# Patient Record
Sex: Female | Born: 1955 | Race: White | Hispanic: No | Smoking: Never smoker
Health system: Southern US, Community
[De-identification: ages and names within clinical notes are randomized; demographics above are authoritative.]

## PROBLEM LIST (undated history)

## (undated) DIAGNOSIS — J189 Pneumonia, unspecified organism: Secondary | ICD-10-CM

## (undated) DIAGNOSIS — Z8489 Family history of other specified conditions: Secondary | ICD-10-CM

## (undated) DIAGNOSIS — I10 Essential (primary) hypertension: Secondary | ICD-10-CM

## (undated) DIAGNOSIS — J42 Unspecified chronic bronchitis: Secondary | ICD-10-CM

## (undated) DIAGNOSIS — J45909 Unspecified asthma, uncomplicated: Secondary | ICD-10-CM

## (undated) DIAGNOSIS — G43909 Migraine, unspecified, not intractable, without status migrainosus: Secondary | ICD-10-CM

## (undated) DIAGNOSIS — M1990 Unspecified osteoarthritis, unspecified site: Secondary | ICD-10-CM

## (undated) DIAGNOSIS — D649 Anemia, unspecified: Secondary | ICD-10-CM

## (undated) DIAGNOSIS — Z8739 Personal history of other diseases of the musculoskeletal system and connective tissue: Secondary | ICD-10-CM

## (undated) HISTORY — PX: EYE SURGERY: SHX253

## (undated) HISTORY — PX: ESOPHAGOGASTRODUODENOSCOPY: SHX1529

## (undated) HISTORY — PX: JOINT REPLACEMENT: SHX530

## (undated) HISTORY — PX: GLAUCOMA SURGERY: SHX656

---

## 1984-12-21 HISTORY — PX: CHOLECYSTECTOMY OPEN: SUR202

## 1999-01-04 ENCOUNTER — Other Ambulatory Visit: Admission: RE | Admit: 1999-01-04 | Discharge: 1999-01-04 | Payer: Self-pay | Admitting: Obstetrics and Gynecology

## 2011-10-16 ENCOUNTER — Emergency Department (HOSPITAL_BASED_OUTPATIENT_CLINIC_OR_DEPARTMENT_OTHER): Payer: BC Managed Care – PPO

## 2011-10-16 ENCOUNTER — Emergency Department (HOSPITAL_BASED_OUTPATIENT_CLINIC_OR_DEPARTMENT_OTHER)
Admission: EM | Admit: 2011-10-16 | Discharge: 2011-10-16 | Disposition: A | Discharge status: Home / Self Care | Payer: BC Managed Care – PPO | Attending: Emergency Medicine | Admitting: Emergency Medicine

## 2011-10-16 ENCOUNTER — Encounter (HOSPITAL_BASED_OUTPATIENT_CLINIC_OR_DEPARTMENT_OTHER): Payer: Self-pay | Admitting: Emergency Medicine

## 2011-10-16 DIAGNOSIS — S59909A Unspecified injury of unspecified elbow, initial encounter: Secondary | ICD-10-CM | POA: Insufficient documentation

## 2011-10-16 DIAGNOSIS — S6990XA Unspecified injury of unspecified wrist, hand and finger(s), initial encounter: Secondary | ICD-10-CM | POA: Insufficient documentation

## 2011-10-16 DIAGNOSIS — S60229A Contusion of unspecified hand, initial encounter: Secondary | ICD-10-CM

## 2011-10-16 DIAGNOSIS — W2209XA Striking against other stationary object, initial encounter: Secondary | ICD-10-CM | POA: Insufficient documentation

## 2011-10-16 DIAGNOSIS — Y92009 Unspecified place in unspecified non-institutional (private) residence as the place of occurrence of the external cause: Secondary | ICD-10-CM | POA: Insufficient documentation

## 2011-10-16 DIAGNOSIS — S59919A Unspecified injury of unspecified forearm, initial encounter: Secondary | ICD-10-CM | POA: Insufficient documentation

## 2011-10-16 HISTORY — DX: Migraine, unspecified, not intractable, without status migrainosus: G43.909

## 2011-10-16 IMAGING — CR DG HAND COMPLETE 3+V*R*
3 series · 3 of 3 positions shown · non-contrast
Comparison: None.

CLINICAL DATA: Fall, pain.

RIGHT HAND - COMPLETE 3+ VIEW

[x hand pa right]
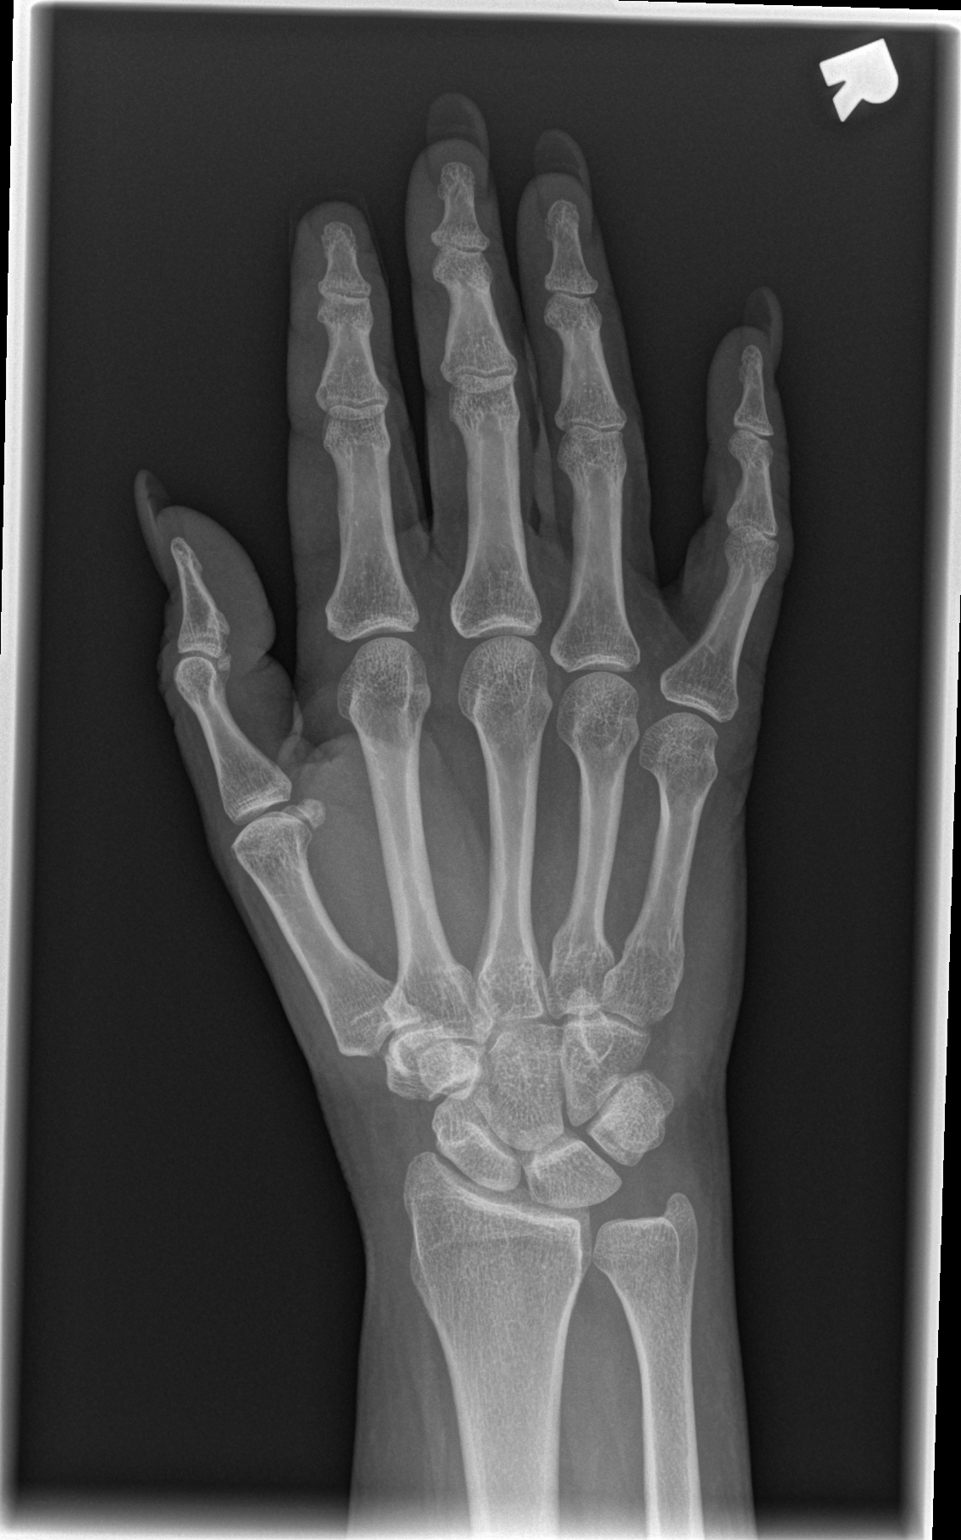

[x hand oblique right]
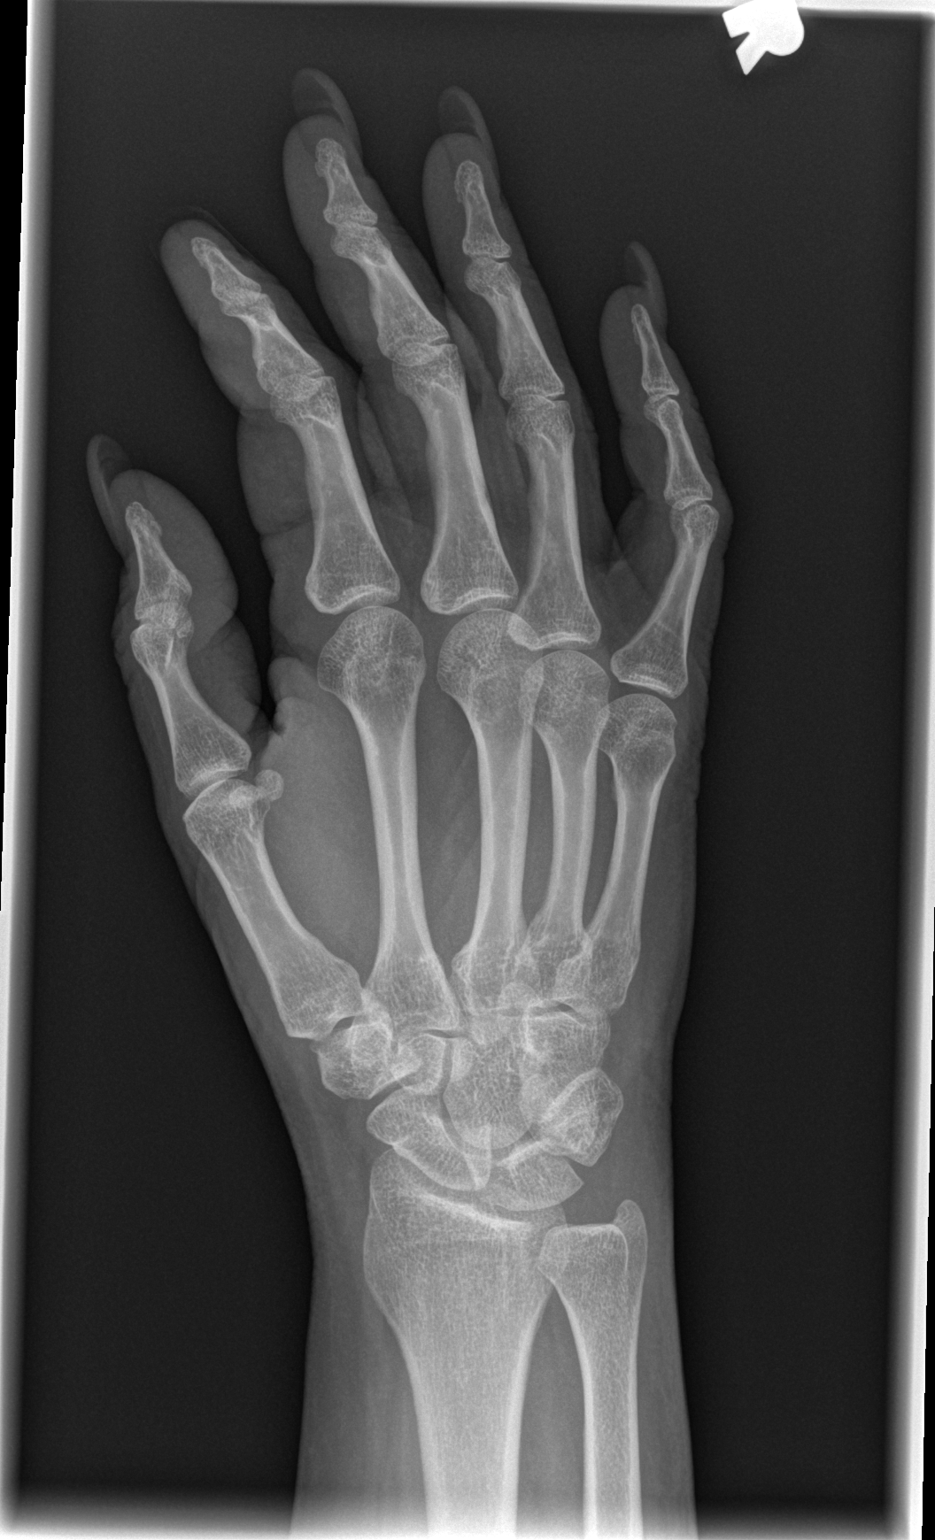

[x hand lat right]
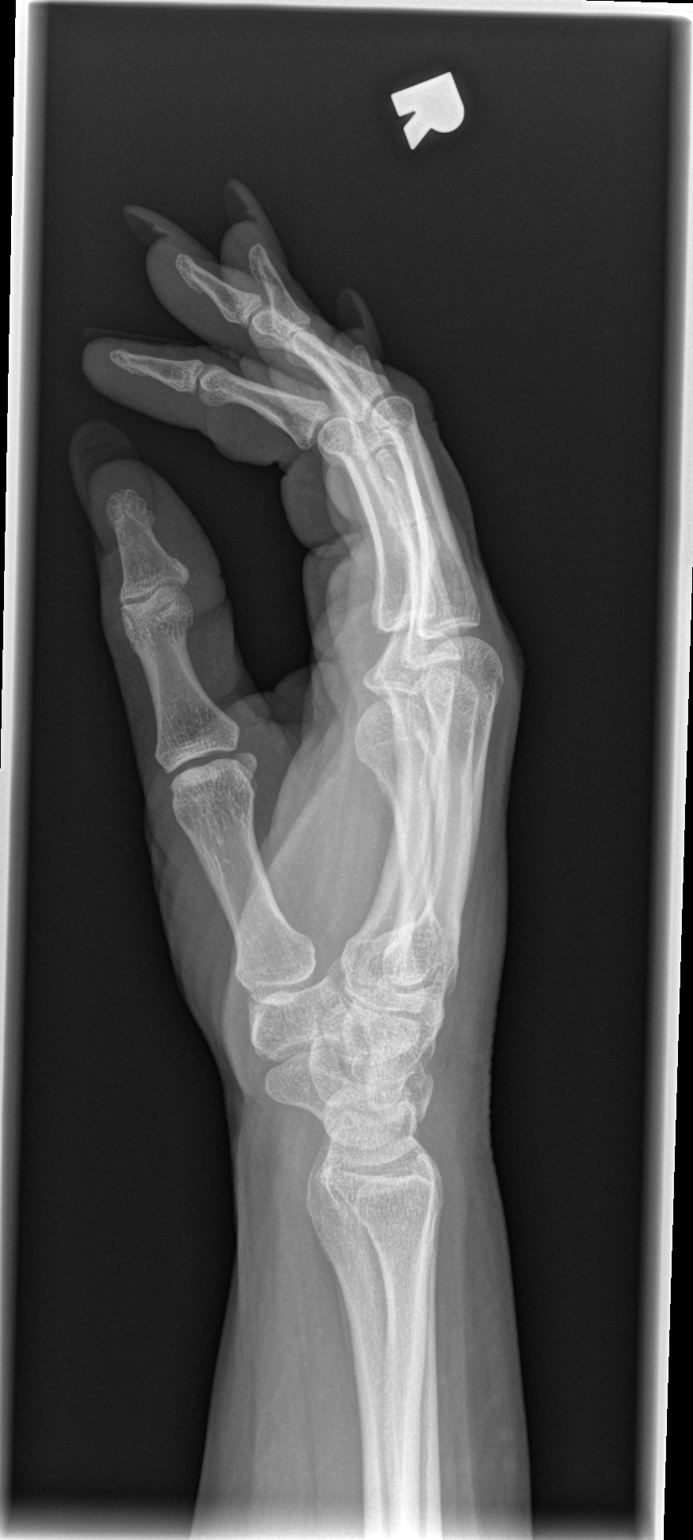

[3 of 3 positions shown; findings below may reference images not displayed]

FINDINGS: Imaged bones, joints and soft tissues appear normal.
IMPRESSION: Negative exam.

## 2011-10-16 MED ORDER — OXYCODONE-ACETAMINOPHEN 5-325 MG PO TABS
2.0000 | ORAL_TABLET | Freq: Once | ORAL | Status: DC
  Filled 2011-10-16: qty 2

## 2011-10-16 MED ORDER — OXYCODONE-ACETAMINOPHEN 5-325 MG PO TABS
1.0000 | ORAL_TABLET | Freq: Once | ORAL | Status: AC
  Administered 2011-10-16: 1 via ORAL
  Filled 2011-10-16 (×2): qty 1

## 2011-10-16 MED ORDER — OXYCODONE-ACETAMINOPHEN 5-325 MG PO TABS: 1.0000 | ORAL_TABLET | Freq: Four times a day (QID) | ORAL | Status: AC | PRN

## 2011-10-16 NOTE — ED Provider Notes (Signed)
History    This chart was scribed for Maximos Zayas B. Bernette Mayers, MD, MD by Smitty Pluck. The patient was seen in room MH09 and the patient's care was started at 10:20PM.   CSN: [id number]  Arrival date & time 10/16/11  2140   First MD Initiated Contact with Patient 10/16/11 2219      Chief Complaint  Patient presents with  . Hand Injury  . Arm Injury    (Consider location/radiation/quality/duration/timing/severity/associated sxs/prior treatment) Patient is a 56 y.o. female presenting with hand injury and arm injury. The history is provided by the patient.  Hand Injury   Arm Injury    Taylor Short is a 56 y.o. female who presents to the Emergency Department complaining of constant, moderate right hand pain and right arm pain onset today within last hour. Pt reports that she tripped on a rug causing her to hit right arm and right hand on wall at home. She reports damaging her nail. Denies active bleeding from nail. Pt reports movement of right arm and right hand aggravates the pain. Pt denies HTN and diabetes. She reports hx of gout and migraines. Denies radiation.   Past Medical History  Diagnosis Date  . Migraine     Past Surgical History  Procedure Date  . Cholecystectomy     No family history on file.  History  Substance Use Topics  . Smoking status: Never Smoker   . Smokeless tobacco: Not on file  . Alcohol Use: No    OB History    Grav Para Term Preterm Abortions TAB SAB Ect Mult Living                  Review of Systems  All other systems reviewed and are negative.   10 Systems reviewed and all are negative for acute change except as noted in the HPI.   Allergies  Aspirin and Codeine  Home Medications  No current outpatient prescriptions on file.  BP 137/86  Pulse 67  Temp 97.8 F (36.6 C) (Oral)  Resp 18  Ht 5\' 6"  (1.676 m)  Wt 185 lb (83.915 kg)  BMI 29.86 kg/m2  SpO2 100%  Physical Exam  Nursing note and vitals reviewed. Constitutional: She  is oriented to person, place, and time. She appears well-developed and well-nourished.  HENT:  Head: Normocephalic and atraumatic.  Neck: Neck supple.  Pulmonary/Chest: Effort normal.  Musculoskeletal:       Contusion to dorsal right hand with superficial abrasion to right hand and right wrist   Neurological: She is alert and oriented to person, place, and time. No cranial nerve deficit.  Psychiatric: She has a normal mood and affect. Her behavior is normal.    ED Course  Procedures (including critical care time) DIAGNOSTIC STUDIES: Oxygen Saturation is 100% on room air, normal by my interpretation.    COORDINATION OF CARE:    Labs Reviewed - No data to display Dg Forearm Right  10/16/2011  *RADIOLOGY REPORT*  Clinical Data: Fall, pain.  RIGHT FOREARM - 2 VIEW  Comparison: None.  Findings: Imaged bones, joints and soft tissues appear normal.  IMPRESSION: Negative exam.   Original Report Authenticated By: Bernadene Bell. Maricela Curet, M.D.    Dg Hand Complete Right  10/16/2011  *RADIOLOGY REPORT*  Clinical Data: Fall, pain.  RIGHT HAND - COMPLETE 3+ VIEW  Comparison: None.  Findings: Imaged bones, joints and soft tissues appear normal.  IMPRESSION: Negative exam.   Original Report Authenticated By: Bernadene Bell. Maricela Curet, M.D.  No diagnosis found.    MDM  Xrays neg as above for fracture. Given velcro wrist splint for comfort. Pain medications as needed.    I personally performed the services described in the documentation, which were scribed in my presence. The recorded information has been reviewed and considered.        Taylor Formoso B. Bernette Mayers, MD 10/16/11 2232

## 2011-10-16 NOTE — ED Notes (Signed)
Pt. Reports hitting a wall with her r hand and R lower arm today.  Pt. Is in no distress but rating pain 9/10

## 2011-10-16 NOTE — ED Notes (Signed)
Pt tripped and hit right hand on door way. Pt has bruising to posterior right hand and reports pain up right arm.

## 2015-08-30 ENCOUNTER — Other Ambulatory Visit: Payer: Self-pay | Admitting: Orthopaedic Surgery

## 2015-09-07 ENCOUNTER — Encounter (HOSPITAL_COMMUNITY)
Admission: RE | Admit: 2015-09-07 | Discharge: 2015-09-07 | Disposition: A | Discharge status: Home / Self Care | Payer: BLUE CROSS/BLUE SHIELD | Source: Ambulatory Visit | Attending: Orthopaedic Surgery | Admitting: Orthopaedic Surgery

## 2015-09-07 ENCOUNTER — Encounter (HOSPITAL_COMMUNITY): Payer: Self-pay

## 2015-09-07 DIAGNOSIS — Z01812 Encounter for preprocedural laboratory examination: Secondary | ICD-10-CM | POA: Insufficient documentation

## 2015-09-07 DIAGNOSIS — Z01810 Encounter for preprocedural cardiovascular examination: Secondary | ICD-10-CM | POA: Diagnosis present

## 2015-09-07 HISTORY — DX: Anemia, unspecified: D64.9

## 2015-09-07 HISTORY — DX: Essential (primary) hypertension: I10

## 2015-09-07 HISTORY — DX: Unspecified osteoarthritis, unspecified site: M19.90

## 2015-09-07 HISTORY — DX: Unspecified asthma, uncomplicated: J45.909

## 2015-09-07 LAB — COMPREHENSIVE METABOLIC PANEL
ALT: 14 U/L (ref 14–54)
ANION GAP: 6 (ref 5–15)
AST: 16 U/L (ref 15–41)
Albumin: 3.7 g/dL (ref 3.5–5.0)
Alkaline Phosphatase: 80 U/L (ref 38–126)
BUN: 12 mg/dL (ref 6–20)
CHLORIDE: 109 mmol/L (ref 101–111)
CO2: 26 mmol/L (ref 22–32)
CREATININE: 0.61 mg/dL (ref 0.44–1.00)
Calcium: 9.3 mg/dL (ref 8.9–10.3)
Glucose, Bld: 94 mg/dL (ref 65–99)
POTASSIUM: 3.7 mmol/L (ref 3.5–5.1)
SODIUM: 141 mmol/L (ref 135–145)
Total Bilirubin: 0.6 mg/dL (ref 0.3–1.2)
Total Protein: 7.1 g/dL (ref 6.5–8.1)

## 2015-09-07 LAB — URINE MICROSCOPIC-ADD ON

## 2015-09-07 LAB — PROTIME-INR
INR: 1 (ref 0.00–1.49)
PROTHROMBIN TIME: 13.4 s (ref 11.6–15.2)

## 2015-09-07 LAB — URINALYSIS, ROUTINE W REFLEX MICROSCOPIC
Bilirubin Urine: NEGATIVE
GLUCOSE, UA: NEGATIVE mg/dL
HGB URINE DIPSTICK: NEGATIVE
KETONES UR: NEGATIVE mg/dL
Nitrite: NEGATIVE
PH: 6 (ref 5.0–8.0)
PROTEIN: NEGATIVE mg/dL
Specific Gravity, Urine: 1.024 (ref 1.005–1.030)

## 2015-09-07 LAB — APTT: APTT: 24 s (ref 24–37)

## 2015-09-07 LAB — TYPE AND SCREEN
ABO/RH(D): O POS
ANTIBODY SCREEN: NEGATIVE

## 2015-09-07 LAB — C-REACTIVE PROTEIN

## 2015-09-07 LAB — SURGICAL PCR SCREEN
MRSA, PCR: NEGATIVE
Staphylococcus aureus: POSITIVE — AB

## 2015-09-07 LAB — ABO/RH: ABO/RH(D): O POS

## 2015-09-07 NOTE — Progress Notes (Signed)
Lab called and stated that CBC and Sed rate labs were clotted need to redraw DOS

## 2015-09-07 NOTE — Pre-Procedure Instructions (Signed)
Claiborne BillingsDee Anna Kucharski  09/07/2015      [id number] [address], [place] - [address] [place] [id number] Phone: [phone] Fax: [phone]    Your procedure is scheduled on 09/16/15  [facility] Admitting at [phone] A.M.  Call this number if you have problems the morning of surgery:  [phone]   Remember:  Do not eat food or drink liquids after midnight.   Take these medicines the morning of surgery with A SIP OF WATER gabapentin (neurontin)  7 days prior to surgery STOP taking any Aspirin, Aleve, Naproxen, Ibuprofen, Motrin, Advil, Goody's, BC's, all herbal medications, fish oil, and all vitamins    Do not wear jewelry, make-up or nail polish.  Do not wear lotions, powders, or perfumes.  You may NOT wear deoderant.  Do not shave 48 hours prior to surgery.   Do not bring valuables to the hospital.  [facility] is not responsible for any belongings or valuables.  Contacts, dentures or bridgework may not be worn into surgery.  Leave your suitcase in the car.  After surgery it may be brought to your room.  For patients admitted to the hospital, discharge time will be determined by your treatment team.  Patients discharged the day of surgery will not be allowed to drive home.    Special instructions:   [facility]- Preparing For Surgery  Before surgery, you can play an important role. Because skin is not sterile, your skin needs to be as free of germs as possible. You can reduce the number of germs on your skin by washing with CHG (chlorahexidine gluconate) Soap before surgery.  CHG is an antiseptic cleaner which kills germs and bonds with the skin to continue killing germs even after washing.  Please do not use if you have an allergy to CHG or antibacterial soaps. If your skin becomes reddened/irritated stop using the CHG.  Do not shave (including legs and underarms) for at least 48 hours prior to first CHG shower. It is OK  to shave your face.  Please follow these instructions carefully.   1. Shower the NIGHT BEFORE SURGERY and the MORNING OF SURGERY with CHG.   2. If you chose to wash your hair, wash your hair first as usual with your normal shampoo.  3. After you shampoo, rinse your hair and body thoroughly to remove the shampoo.  4. Use CHG as you would any other liquid soap. You can apply CHG directly to the skin and wash gently with a scrungie or a clean washcloth.   5. Apply the CHG Soap to your body ONLY FROM THE NECK DOWN.  Do not use on open wounds or open sores. Avoid contact with your eyes, ears, mouth and genitals (private parts). Wash genitals (private parts) with your normal soap.  6. Wash thoroughly, paying special attention to the area where your surgery will be performed.  7. Thoroughly rinse your body with warm water from the neck down.  8. DO NOT shower/wash with your normal soap after using and rinsing off the CHG Soap.  9. Pat yourself dry with a CLEAN TOWEL.   10. Wear CLEAN PAJAMAS   11. Place CLEAN SHEETS on your bed the night of your first shower and DO NOT SLEEP WITH PETS.    Day of Surgery: Do not apply any deodorants/lotions. Please wear clean clothes to the hospital/surgery center.      Please read over the following fact sheets  that you were given. Pain Booklet, Coughing and Deep Breathing, Total Joint Packet, MRSA Information and Surgical Site Infection Prevention, incentive spirometry

## 2015-09-07 NOTE — Progress Notes (Signed)
PCP - Deneen Hartselizabeth Todd - [facility] Cardi[facility] - denies  Chest x-ray - not needed EKG - 09/07/15 Stress Test - denies ECHO - denies Cardiac Cath - denies  Patient's blood pressure was elevated 190s/90s, patient stated that her PCP told her they may need to look at blood pressure medications for her.  Contacted allison about EKG and possible follow-up about pressure...allison agreed.  Patient was instructed to call PCP today and schedule an appointment     Patient denies shortness of breath, fever, cough and chest pain at PAT appointment

## 2015-09-07 NOTE — Progress Notes (Signed)
Patient PCR was positive for staph, [facility] [place] [phone].  Left message on patient's phone about instructions and where to pick up prescription up at

## 2015-09-08 ENCOUNTER — Encounter (HOSPITAL_COMMUNITY): Payer: Self-pay

## 2015-09-08 NOTE — Progress Notes (Signed)
Anesthesia Chart Review: Patient is a 60 year old female scheduled for left THA, anterior approach on 09/16/15 by Dr. Roda ShuttersXu.   History includes non-smoker, migraines, anemia, asthma, HTN (untreated until 09/07/15). PCP is Deneen HartsElizabeth Todd, FNP with [facility](Care Everywhere).  PAT Vitals: BP 190s/90s" on arrival and 170/96 following her nurse visit. HR 69, RR 20, T 36.6C, O2 sat 100%. (At her PAT visit, she did not report any anti-hypertensive medications but reported that she was told she may need to start one in the near future. Due to her elevated BP at PAT, she was advised to follow-up with her PCP office regarding getting BP better controlled prior to surgery. She was seen by [place] Fulbright, PA-C on 09/08/15. Her note indicates that patient had previously been prescribed lisinopril, but patient never got it filled until yesterday when she learned her uncontrolled HTN could delay her surgery. She apparently started lisinopril with BP today of 130/90 at that visit.)  Meds include albuterol, gabapentin, meloxicam. Lisinopril 10 mg recently started.  09/07/15 EKG: NSR.  Preoperative labs WNL.   I updated Sherrie at Dr. Warren DanesXu's office of HTN follow-up and that she was started on Lotrisone cream for breast rash at that visit. If BP is acceptable and otherwise no acute changes on her surgery date then I would anticipate that she could proceed as planned.  Velna Ochsllison Eleena Grater, PA-C [facility]/Anesthesiology Phone [phone] 09/08/2015 3:05 PM

## 2015-09-15 MED ORDER — TRANEXAMIC ACID 1000 MG/10ML IV SOLN
1000.0000 mg | INTRAVENOUS | Status: AC
  Administered 2015-09-16: 1000 mg via INTRAVENOUS
  Filled 2015-09-15: qty 10

## 2015-09-15 MED ORDER — CEFAZOLIN SODIUM-DEXTROSE 2-4 GM/100ML-% IV SOLN
2.0000 g | INTRAVENOUS | Status: AC
  Administered 2015-09-16: 2 g via INTRAVENOUS
  Filled 2015-09-15: qty 100

## 2015-09-16 ENCOUNTER — Inpatient Hospital Stay (HOSPITAL_COMMUNITY): Payer: BLUE CROSS/BLUE SHIELD | Admitting: Vascular Surgery

## 2015-09-16 ENCOUNTER — Encounter (HOSPITAL_COMMUNITY)
Admission: RE | Disposition: A | Discharge status: Home Health | Payer: Self-pay | Source: Ambulatory Visit | Attending: Orthopaedic Surgery

## 2015-09-16 ENCOUNTER — Inpatient Hospital Stay (HOSPITAL_COMMUNITY): Payer: BLUE CROSS/BLUE SHIELD | Admitting: Anesthesiology

## 2015-09-16 ENCOUNTER — Inpatient Hospital Stay (HOSPITAL_COMMUNITY): Payer: BLUE CROSS/BLUE SHIELD

## 2015-09-16 ENCOUNTER — Encounter (HOSPITAL_COMMUNITY): Payer: Self-pay | Admitting: Anesthesiology

## 2015-09-16 ENCOUNTER — Inpatient Hospital Stay (HOSPITAL_COMMUNITY)
Admission: RE | Admit: 2015-09-16 | Discharge: 2015-09-18 | DRG: 470 | Disposition: A | Discharge status: Home Health | Payer: BLUE CROSS/BLUE SHIELD | Source: Ambulatory Visit | Attending: Orthopaedic Surgery | Admitting: Orthopaedic Surgery

## 2015-09-16 DIAGNOSIS — I10 Essential (primary) hypertension: Secondary | ICD-10-CM | POA: Diagnosis present

## 2015-09-16 DIAGNOSIS — M25552 Pain in left hip: Secondary | ICD-10-CM | POA: Diagnosis present

## 2015-09-16 DIAGNOSIS — G43909 Migraine, unspecified, not intractable, without status migrainosus: Secondary | ICD-10-CM | POA: Diagnosis present

## 2015-09-16 DIAGNOSIS — Z791 Long term (current) use of non-steroidal anti-inflammatories (NSAID): Secondary | ICD-10-CM

## 2015-09-16 DIAGNOSIS — Z96642 Presence of left artificial hip joint: Secondary | ICD-10-CM

## 2015-09-16 DIAGNOSIS — D62 Acute posthemorrhagic anemia: Secondary | ICD-10-CM | POA: Diagnosis not present

## 2015-09-16 DIAGNOSIS — M1612 Unilateral primary osteoarthritis, left hip: Principal | ICD-10-CM | POA: Diagnosis present

## 2015-09-16 DIAGNOSIS — Z96649 Presence of unspecified artificial hip joint: Secondary | ICD-10-CM

## 2015-09-16 DIAGNOSIS — Z419 Encounter for procedure for purposes other than remedying health state, unspecified: Secondary | ICD-10-CM

## 2015-09-16 HISTORY — DX: Pneumonia, unspecified organism: J18.9

## 2015-09-16 HISTORY — DX: Family history of other specified conditions: Z84.89

## 2015-09-16 HISTORY — DX: Unspecified chronic bronchitis: J42

## 2015-09-16 HISTORY — DX: Personal history of other diseases of the musculoskeletal system and connective tissue: Z87.39

## 2015-09-16 HISTORY — PX: TOTAL HIP ARTHROPLASTY: SHX124

## 2015-09-16 LAB — CBC WITH DIFFERENTIAL/PLATELET
Basophils Absolute: 0 10*3/uL (ref 0.0–0.1)
Basophils Relative: 1 %
EOS ABS: 0.1 10*3/uL (ref 0.0–0.7)
EOS PCT: 1 %
HCT: 41.6 % (ref 36.0–46.0)
HEMOGLOBIN: 13.8 g/dL (ref 12.0–15.0)
LYMPHS ABS: 1.5 10*3/uL (ref 0.7–4.0)
LYMPHS PCT: 25 %
MCH: 30.2 pg (ref 26.0–34.0)
MCHC: 33.2 g/dL (ref 30.0–36.0)
MCV: 91 fL (ref 78.0–100.0)
MONOS PCT: 10 %
Monocytes Absolute: 0.6 10*3/uL (ref 0.1–1.0)
Neutro Abs: 3.8 10*3/uL (ref 1.7–7.7)
Neutrophils Relative %: 63 %
PLATELETS: 240 10*3/uL (ref 150–400)
RBC: 4.57 MIL/uL (ref 3.87–5.11)
RDW: 13.8 % (ref 11.5–15.5)
WBC: 6.1 10*3/uL (ref 4.0–10.5)

## 2015-09-16 LAB — CBC
HEMATOCRIT: 36.7 % (ref 36.0–46.0)
Hemoglobin: 11.5 g/dL — ABNORMAL LOW (ref 12.0–15.0)
MCH: 29.3 pg (ref 26.0–34.0)
MCHC: 31.3 g/dL (ref 30.0–36.0)
MCV: 93.4 fL (ref 78.0–100.0)
PLATELETS: 210 10*3/uL (ref 150–400)
RBC: 3.93 MIL/uL (ref 3.87–5.11)
RDW: 13.5 % (ref 11.5–15.5)
WBC: 10.8 10*3/uL — AB (ref 4.0–10.5)

## 2015-09-16 LAB — CREATININE, SERUM: CREATININE: 0.58 mg/dL (ref 0.44–1.00)

## 2015-09-16 LAB — SEDIMENTATION RATE: Sed Rate: 15 mm/hr (ref 0–22)

## 2015-09-16 SURGERY — ARTHROPLASTY, HIP, TOTAL, ANTERIOR APPROACH
Anesthesia: Spinal | Site: Hip | Laterality: Left

## 2015-09-16 MED ORDER — ACETAMINOPHEN 650 MG RE SUPP: 650.0000 mg | Freq: Four times a day (QID) | RECTAL | Status: DC | PRN

## 2015-09-16 MED ORDER — METHOCARBAMOL 1000 MG/10ML IJ SOLN
500.0000 mg | Freq: Four times a day (QID) | INTRAMUSCULAR | Status: DC | PRN
  Filled 2015-09-16: qty 5

## 2015-09-16 MED ORDER — HYDROMORPHONE HCL 1 MG/ML IJ SOLN
INTRAMUSCULAR | Status: AC
  Filled 2015-09-16: qty 1

## 2015-09-16 MED ORDER — MAGNESIUM CITRATE PO SOLN: 1.0000 | Freq: Once | ORAL | Status: DC | PRN

## 2015-09-16 MED ORDER — KETOROLAC TROMETHAMINE 30 MG/ML IJ SOLN
INTRAMUSCULAR | Status: AC
  Filled 2015-09-16: qty 1

## 2015-09-16 MED ORDER — METOCLOPRAMIDE HCL 5 MG PO TABS: 5.0000 mg | ORAL_TABLET | Freq: Three times a day (TID) | ORAL | Status: DC | PRN

## 2015-09-16 MED ORDER — SODIUM CHLORIDE 0.9% FLUSH
INTRAVENOUS | Status: DC | PRN
  Administered 2015-09-16: 40 mL

## 2015-09-16 MED ORDER — DIPHENHYDRAMINE HCL 12.5 MG/5ML PO ELIX: 25.0000 mg | ORAL_SOLUTION | ORAL | Status: DC | PRN

## 2015-09-16 MED ORDER — METOCLOPRAMIDE HCL 5 MG/ML IJ SOLN
5.0000 mg | Freq: Three times a day (TID) | INTRAMUSCULAR | Status: DC | PRN
  Administered 2015-09-16 – 2015-09-17 (×3): 10 mg via INTRAVENOUS
  Filled 2015-09-16 (×3): qty 2

## 2015-09-16 MED ORDER — METHOCARBAMOL 500 MG PO TABS
ORAL_TABLET | ORAL | Status: AC
  Filled 2015-09-16: qty 1

## 2015-09-16 MED ORDER — TRANEXAMIC ACID 1000 MG/10ML IV SOLN
INTRAVENOUS | Status: DC | PRN
  Administered 2015-09-16: 2000 mg via TOPICAL

## 2015-09-16 MED ORDER — SODIUM CHLORIDE 0.9 % IV SOLN
INTRAVENOUS | Status: DC
  Administered 2015-09-16 – 2015-09-17 (×4): via INTRAVENOUS

## 2015-09-16 MED ORDER — FENTANYL CITRATE (PF) 100 MCG/2ML IJ SOLN
INTRAMUSCULAR | Status: DC | PRN
  Administered 2015-09-16: 50 ug via INTRAVENOUS
  Administered 2015-09-16: 100 ug via INTRAVENOUS
  Administered 2015-09-16 (×2): 50 ug via INTRAVENOUS

## 2015-09-16 MED ORDER — OXYCODONE HCL ER 10 MG PO T12A: 10.0000 mg | EXTENDED_RELEASE_TABLET | Freq: Two times a day (BID) | ORAL | Status: DC

## 2015-09-16 MED ORDER — METHOCARBAMOL 750 MG PO TABS: 750.0000 mg | ORAL_TABLET | Freq: Two times a day (BID) | ORAL | 0 refills | Status: DC | PRN

## 2015-09-16 MED ORDER — CHLORHEXIDINE GLUCONATE 4 % EX LIQD: 60.0000 mL | Freq: Once | CUTANEOUS | Status: DC

## 2015-09-16 MED ORDER — MIDAZOLAM HCL 5 MG/5ML IJ SOLN
INTRAMUSCULAR | Status: DC | PRN
  Administered 2015-09-16: 2 mg via INTRAVENOUS

## 2015-09-16 MED ORDER — FENTANYL CITRATE (PF) 250 MCG/5ML IJ SOLN
INTRAMUSCULAR | Status: AC
  Filled 2015-09-16: qty 5

## 2015-09-16 MED ORDER — SORBITOL 70 % SOLN: 30.0000 mL | Freq: Every day | Status: DC | PRN

## 2015-09-16 MED ORDER — BUPIVACAINE LIPOSOME 1.3 % IJ SUSP
20.0000 mL | INTRAMUSCULAR | Status: AC
  Administered 2015-09-16: 20 mL
  Filled 2015-09-16: qty 20

## 2015-09-16 MED ORDER — ACETAMINOPHEN 500 MG PO TABS
1000.0000 mg | ORAL_TABLET | Freq: Four times a day (QID) | ORAL | Status: AC
  Administered 2015-09-16 – 2015-09-17 (×4): 1000 mg via ORAL
  Filled 2015-09-16 (×4): qty 2

## 2015-09-16 MED ORDER — GABAPENTIN 300 MG PO CAPS
600.0000 mg | ORAL_CAPSULE | Freq: Every day | ORAL | Status: DC
  Administered 2015-09-16 – 2015-09-17 (×2): 600 mg via ORAL
  Filled 2015-09-16 (×4): qty 2

## 2015-09-16 MED ORDER — ENOXAPARIN SODIUM 40 MG/0.4ML ~~LOC~~ SOLN: 40.0000 mg | Freq: Every day | SUBCUTANEOUS | 0 refills | Status: DC

## 2015-09-16 MED ORDER — SENNOSIDES-DOCUSATE SODIUM 8.6-50 MG PO TABS: 1.0000 | ORAL_TABLET | Freq: Every evening | ORAL | 1 refills | Status: DC | PRN

## 2015-09-16 MED ORDER — PROPOFOL 500 MG/50ML IV EMUL
INTRAVENOUS | Status: DC | PRN
  Administered 2015-09-16: 25 ug/kg/min via INTRAVENOUS

## 2015-09-16 MED ORDER — EPHEDRINE SULFATE 50 MG/ML IJ SOLN
INTRAMUSCULAR | Status: DC | PRN
  Administered 2015-09-16: 10 mg via INTRAVENOUS

## 2015-09-16 MED ORDER — MENTHOL 3 MG MT LOZG: 1.0000 | LOZENGE | OROMUCOSAL | Status: DC | PRN

## 2015-09-16 MED ORDER — CEFAZOLIN SODIUM-DEXTROSE 2-4 GM/100ML-% IV SOLN
2.0000 g | Freq: Four times a day (QID) | INTRAVENOUS | Status: AC
  Administered 2015-09-16 (×2): 2 g via INTRAVENOUS
  Filled 2015-09-16 (×2): qty 100

## 2015-09-16 MED ORDER — MORPHINE SULFATE (PF) 2 MG/ML IV SOLN
1.0000 mg | INTRAVENOUS | Status: DC | PRN
  Administered 2015-09-16 – 2015-09-17 (×2): 1 mg via INTRAVENOUS
  Filled 2015-09-16 (×2): qty 1

## 2015-09-16 MED ORDER — KETOROLAC TROMETHAMINE 30 MG/ML IJ SOLN
30.0000 mg | Freq: Four times a day (QID) | INTRAMUSCULAR | Status: AC | PRN
  Administered 2015-09-16 (×2): 30 mg via INTRAVENOUS
  Filled 2015-09-16: qty 1

## 2015-09-16 MED ORDER — HYDROMORPHONE HCL 2 MG PO TABS
2.0000 mg | ORAL_TABLET | ORAL | Status: DC | PRN
  Administered 2015-09-16 – 2015-09-18 (×7): 2 mg via ORAL
  Filled 2015-09-16 (×7): qty 1

## 2015-09-16 MED ORDER — CELECOXIB 200 MG PO CAPS
200.0000 mg | ORAL_CAPSULE | Freq: Two times a day (BID) | ORAL | Status: DC
  Administered 2015-09-16 – 2015-09-18 (×4): 200 mg via ORAL
  Filled 2015-09-16 (×5): qty 1

## 2015-09-16 MED ORDER — HYDROMORPHONE HCL 2 MG PO TABS: 2.0000 mg | ORAL_TABLET | ORAL | 0 refills | Status: DC | PRN

## 2015-09-16 MED ORDER — SODIUM CHLORIDE 0.9 % IR SOLN
Status: DC | PRN
  Administered 2015-09-16: 1000 mL
  Administered 2015-09-16: 3000 mL

## 2015-09-16 MED ORDER — POVIDONE-IODINE 10 % EX SOLN
CUTANEOUS | Status: DC | PRN
  Administered 2015-09-16: 1 via TOPICAL

## 2015-09-16 MED ORDER — LACTATED RINGERS IV SOLN
INTRAVENOUS | Status: DC | PRN
  Administered 2015-09-16 (×2): via INTRAVENOUS

## 2015-09-16 MED ORDER — HYDROMORPHONE HCL 1 MG/ML IJ SOLN
0.2500 mg | INTRAMUSCULAR | Status: DC | PRN
  Administered 2015-09-16 (×4): 0.5 mg via INTRAVENOUS

## 2015-09-16 MED ORDER — ENOXAPARIN SODIUM 40 MG/0.4ML ~~LOC~~ SOLN
40.0000 mg | SUBCUTANEOUS | Status: DC
  Administered 2015-09-17 – 2015-09-18 (×2): 40 mg via SUBCUTANEOUS
  Filled 2015-09-16 (×2): qty 0.4

## 2015-09-16 MED ORDER — ONDANSETRON HCL 4 MG/2ML IJ SOLN
4.0000 mg | Freq: Four times a day (QID) | INTRAMUSCULAR | Status: DC | PRN
  Administered 2015-09-16 – 2015-09-17 (×2): 4 mg via INTRAVENOUS
  Filled 2015-09-16 (×2): qty 2

## 2015-09-16 MED ORDER — PROPOFOL 10 MG/ML IV BOLUS
INTRAVENOUS | Status: DC | PRN
  Administered 2015-09-16 (×2): 10 mg via INTRAVENOUS
  Administered 2015-09-16: 20 mg via INTRAVENOUS
  Administered 2015-09-16 (×3): 10 mg via INTRAVENOUS
  Administered 2015-09-16: 20 mg via INTRAVENOUS

## 2015-09-16 MED ORDER — 0.9 % SODIUM CHLORIDE (POUR BTL) OPTIME
TOPICAL | Status: DC | PRN
  Administered 2015-09-16: 1000 mL

## 2015-09-16 MED ORDER — POLYETHYLENE GLYCOL 3350 17 G PO PACK
17.0000 g | PACK | Freq: Every day | ORAL | Status: DC | PRN
  Administered 2015-09-17: 17 g via ORAL
  Filled 2015-09-16: qty 1

## 2015-09-16 MED ORDER — ONDANSETRON HCL 4 MG PO TABS: 4.0000 mg | ORAL_TABLET | Freq: Four times a day (QID) | ORAL | Status: DC | PRN

## 2015-09-16 MED ORDER — ONDANSETRON HCL 4 MG/2ML IJ SOLN
INTRAMUSCULAR | Status: DC | PRN
  Administered 2015-09-16: 4 mg via INTRAVENOUS

## 2015-09-16 MED ORDER — PHENOL 1.4 % MT LIQD
1.0000 | OROMUCOSAL | Status: DC | PRN
Start: 2015-09-16 — End: 2015-09-18

## 2015-09-16 MED ORDER — TRANEXAMIC ACID 1000 MG/10ML IV SOLN
1000.0000 mg | Freq: Once | INTRAVENOUS | Status: AC
  Administered 2015-09-16: 1000 mg via INTRAVENOUS
  Filled 2015-09-16: qty 10

## 2015-09-16 MED ORDER — MIDAZOLAM HCL 2 MG/2ML IJ SOLN
INTRAMUSCULAR | Status: AC
  Filled 2015-09-16: qty 2

## 2015-09-16 MED ORDER — ALBUTEROL SULFATE (2.5 MG/3ML) 0.083% IN NEBU: 3.0000 mL | INHALATION_SOLUTION | RESPIRATORY_TRACT | Status: DC | PRN

## 2015-09-16 MED ORDER — ALUM & MAG HYDROXIDE-SIMETH 200-200-20 MG/5ML PO SUSP: 30.0000 mL | ORAL | Status: DC | PRN

## 2015-09-16 MED ORDER — TRANEXAMIC ACID 1000 MG/10ML IV SOLN
2000.0000 mg | INTRAVENOUS | Status: DC
  Filled 2015-09-16: qty 20

## 2015-09-16 MED ORDER — PROPOFOL 10 MG/ML IV BOLUS
INTRAVENOUS | Status: AC
  Filled 2015-09-16: qty 20

## 2015-09-16 MED ORDER — ONDANSETRON HCL 4 MG PO TABS
4.0000 mg | ORAL_TABLET | Freq: Three times a day (TID) | ORAL | 0 refills | Status: DC | PRN
Start: 2015-09-16 — End: 2021-03-08

## 2015-09-16 MED ORDER — OXYCODONE HCL ER 10 MG PO T12A: 10.0000 mg | EXTENDED_RELEASE_TABLET | Freq: Two times a day (BID) | ORAL | 0 refills | Status: DC

## 2015-09-16 MED ORDER — ACETAMINOPHEN 325 MG PO TABS
650.0000 mg | ORAL_TABLET | Freq: Four times a day (QID) | ORAL | Status: DC | PRN
  Administered 2015-09-17: 650 mg via ORAL
  Filled 2015-09-16: qty 2

## 2015-09-16 MED ORDER — METHOCARBAMOL 500 MG PO TABS
500.0000 mg | ORAL_TABLET | Freq: Four times a day (QID) | ORAL | Status: DC | PRN
  Administered 2015-09-16 – 2015-09-18 (×4): 500 mg via ORAL
  Filled 2015-09-16 (×4): qty 1

## 2015-09-16 MED ORDER — LACTATED RINGERS IV SOLN
INTRAVENOUS | Status: DC
  Administered 2015-09-16: 10:00:00 via INTRAVENOUS

## 2015-09-16 MED ORDER — DEXAMETHASONE SODIUM PHOSPHATE 10 MG/ML IJ SOLN
10.0000 mg | Freq: Once | INTRAMUSCULAR | Status: AC
  Administered 2015-09-17: 10 mg via INTRAVENOUS
  Filled 2015-09-16: qty 1

## 2015-09-16 SURGICAL SUPPLY — 54 items
BAG DECANTER FOR FLEXI CONT (MISCELLANEOUS) ×2 IMPLANT
CAPT HIP TOTAL 2 ×1 IMPLANT
CELLS DAT CNTRL 66122 CELL SVR (MISCELLANEOUS) ×1 IMPLANT
CLSR STERI-STRIP ANTIMIC 1/2X4 (GAUZE/BANDAGES/DRESSINGS) ×1 IMPLANT
COVER SURGICAL LIGHT HANDLE (MISCELLANEOUS) ×2 IMPLANT
DRAPE C-ARM 42X72 X-RAY (DRAPES) ×2 IMPLANT
DRAPE STERI IOBAN 125X83 (DRAPES) ×2 IMPLANT
DRAPE U-SHAPE 47X51 STRL (DRAPES) ×6 IMPLANT
DRSG AQUACEL AG ADV 3.5X10 (GAUZE/BANDAGES/DRESSINGS) ×2 IMPLANT
DURAPREP 26ML APPLICATOR (WOUND CARE) ×2 IMPLANT
ELECT BLADE 4.0 EZ CLEAN MEGAD (MISCELLANEOUS) ×2
ELECT REM PT RETURN 9FT ADLT (ELECTROSURGICAL) ×2
ELECTRODE BLDE 4.0 EZ CLN MEGD (MISCELLANEOUS) ×1 IMPLANT
ELECTRODE REM PT RTRN 9FT ADLT (ELECTROSURGICAL) ×1 IMPLANT
GLOVE SKINSENSE NS SZ7.5 (GLOVE) ×1
GLOVE SKINSENSE STRL SZ7.5 (GLOVE) ×1 IMPLANT
GLOVE SURG SYN 7.5  E (GLOVE) ×2
GLOVE SURG SYN 7.5 E (GLOVE) ×2 IMPLANT
GLOVE SURG SYN 7.5 PF PI (GLOVE) ×2 IMPLANT
GOWN SRG XL XLNG 56XLVL 4 (GOWN DISPOSABLE) ×1 IMPLANT
GOWN STRL NON-REIN XL XLG LVL4 (GOWN DISPOSABLE) ×2
GOWN STRL REUS W/ TWL LRG LVL3 (GOWN DISPOSABLE) IMPLANT
GOWN STRL REUS W/TWL LRG LVL3 (GOWN DISPOSABLE)
HANDPIECE INTERPULSE COAX TIP (DISPOSABLE) ×2
HOOD PEEL AWAY FLYTE STAYCOOL (MISCELLANEOUS) ×4 IMPLANT
IV NS 1000ML (IV SOLUTION) ×2
IV NS 1000ML BAXH (IV SOLUTION) ×1 IMPLANT
IV NS IRRIG 3000ML ARTHROMATIC (IV SOLUTION) ×2 IMPLANT
KIT BASIN OR (CUSTOM PROCEDURE TRAY) ×2 IMPLANT
MARKER SKIN DUAL TIP RULER LAB (MISCELLANEOUS) ×2 IMPLANT
NDL SPNL 18GX3.5 QUINCKE PK (NEEDLE) ×1 IMPLANT
NEEDLE SPNL 18GX3.5 QUINCKE PK (NEEDLE) ×2 IMPLANT
PACK TOTAL JOINT (CUSTOM PROCEDURE TRAY) ×2 IMPLANT
PACK UNIVERSAL I (CUSTOM PROCEDURE TRAY) ×2 IMPLANT
RETRACTOR WND ALEXIS 18 MED (MISCELLANEOUS) ×1 IMPLANT
RTRCTR WOUND ALEXIS 18CM MED (MISCELLANEOUS) ×2
SAW OSC TIP CART 19.5X105X1.3 (SAW) ×2 IMPLANT
SEALER BIPOLAR AQUA 6.0 (INSTRUMENTS) ×2 IMPLANT
SET HNDPC FAN SPRY TIP SCT (DISPOSABLE) ×1 IMPLANT
STAPLER VISISTAT 35W (STAPLE) IMPLANT
SUT ETHIBOND 2 V 37 (SUTURE) ×2 IMPLANT
SUT ETHIBOND NAB CT1 #1 30IN (SUTURE) ×6 IMPLANT
SUT MNCRL AB 4-0 PS2 18 (SUTURE) ×1 IMPLANT
SUT VIC AB 0 CT1 27 (SUTURE) ×2
SUT VIC AB 0 CT1 27XBRD ANBCTR (SUTURE) IMPLANT
SUT VIC AB 1 CT1 27 (SUTURE) ×2
SUT VIC AB 1 CT1 27XBRD ANBCTR (SUTURE) ×1 IMPLANT
SUT VIC AB 2-0 CT1 27 (SUTURE) ×4
SUT VIC AB 2-0 CT1 TAPERPNT 27 (SUTURE) ×1 IMPLANT
SYR 20CC LL (SYRINGE) ×2 IMPLANT
SYR 50ML LL SCALE MARK (SYRINGE) ×2 IMPLANT
TOWEL OR 17X26 10 PK STRL BLUE (TOWEL DISPOSABLE) ×2 IMPLANT
TRAY CATH 16FR W/PLASTIC CATH (SET/KITS/TRAYS/PACK) ×2 IMPLANT
YANKAUER SUCT BULB TIP NO VENT (SUCTIONS) ×2 IMPLANT

## 2015-09-16 NOTE — Anesthesia Procedure Notes (Signed)
Procedure Name: MAC Date/Time: 09/16/2015 12:41 PM Performed by: Wray Kearns A Pre-anesthesia Checklist: Patient identified, Emergency Drugs available, Suction available, Patient being monitored and Timeout performed Patient Re-evaluated:Patient Re-evaluated prior to inductionOxygen Delivery Method: Simple face mask Intubation Type: IV induction Placement Confirmation: positive ETCO2 Dental Injury: Teeth and Oropharynx as per pre-operative assessment

## 2015-09-16 NOTE — Anesthesia Procedure Notes (Signed)
Spinal  Patient location during procedure: OR Preanesthetic Checklist Completed: patient identified, site marked, surgical consent, pre-op evaluation, timeout performed, IV checked, risks and benefits discussed and monitors and equipment checked Spinal Block Patient position: sitting Prep: DuraPrep Patient monitoring: heart rate, cardiac monitor, continuous pulse ox and blood pressure Approach: midline Location: L3-4 Injection technique: single-shot Needle Needle type: Sprotte  Needle gauge: 24 G Needle length: 9 cm Assessment Sensory level: T4 Additional Notes Spinal Dosage in OR  Bupivicaine ml       1.9  LLD x 2 min     

## 2015-09-16 NOTE — Discharge Instructions (Signed)

## 2015-09-16 NOTE — Transfer of Care (Signed)
Immediate Anesthesia Transfer of Care Note  Patient: Taylor Short  Procedure(s) Performed: Procedure(s): LEFT TOTAL HIP ARTHROPLASTY ANTERIOR APPROACH (Left)  Patient Location: PACU  Anesthesia Type:Spinal  Level of Consciousness: awake  Airway & Oxygen Therapy: Patient Spontanous Breathing  Post-op Assessment: Report given to RN and Post -op Vital signs reviewed and stable  Post vital signs: Reviewed and stable  Last Vitals:  Vitals:   09/16/15 0939  BP: (!) 178/90  Pulse: 72  Resp: 18  Temp: 37.3 C    Last Pain:  Vitals:   09/16/15 0959  TempSrc:   PainSc: 5          Complications: No apparent anesthesia complications

## 2015-09-16 NOTE — Anesthesia Preprocedure Evaluation (Signed)
Anesthesia Evaluation  Patient identified by MRN, date of birth, ID band Patient awake    Reviewed: Allergy & Precautions, H&P , Patient's Chart, lab work & pertinent test results  Airway Mallampati: II  TM Distance: >3 FB Neck ROM: full    Dental no notable dental hx.    Pulmonary asthma ,    Pulmonary exam normal breath sounds clear to auscultation       Cardiovascular Exercise Tolerance: Good hypertension,  Rhythm:regular Rate:Normal     Neuro/Psych    GI/Hepatic   Endo/Other    Renal/GU      Musculoskeletal   Abdominal   Peds  Hematology   Anesthesia Other Findings   Reproductive/Obstetrics                             Anesthesia Physical Anesthesia Plan  ASA: II  Anesthesia Plan: Spinal   Post-op Pain Management:    Induction:   Airway Management Planned:   Additional Equipment:   Intra-op Plan:   Post-operative Plan:   Informed Consent: I have reviewed the patients History and Physical, chart, labs and discussed the procedure including the risks, benefits and alternatives for the proposed anesthesia with the patient or authorized representative who has indicated his/her understanding and acceptance.   Dental Advisory Given  Plan Discussed with: CRNA  Anesthesia Plan Comments: (Lab work confirmed with CRNA in room. Platelets okay. Discussed spinal anesthetic, and patient consents to the procedure:  included risk of possible headache,backache, failed block, allergic reaction, and nerve injury. This patient was asked if she had any questions or concerns before the procedure started. )        Anesthesia Quick Evaluation

## 2015-09-16 NOTE — Progress Notes (Signed)
Pt admitted to the room from pacu; pt A&O x4; pt oriented to the unit and room; fall/safety precaution and prevention education completed with pt; pt IV intact and transfusing; VSS: foley intact and unclamped; left hip incision Mepilex dsg clean, dry and intact with no stain or active bleeding noted. Pt report slight numbness and tingling to LLE d/t spinal anesthesia received during surgery but pt able to wriggle her toes with full sensation when touched. Pt report sickness to her stomach and not nauseated; pt prn zofran given and ginger ale given to pt; Pt family at bedside; call light within reach and will closely monitor pt. Dionne Bucy RN

## 2015-09-16 NOTE — Op Note (Signed)
LEFT TOTAL HIP ARTHROPLASTY ANTERIOR APPROACH  Procedure Note Taylor Short   [id number]  Pre-op Diagnosis: Left hip osteoarthritis     Post-op Diagnosis: same   Operative Procedures  1. Total hip replacement; Left hip; uncemented [id number]   Personnel  Surgeon(s): Tarry Kos, MD   Anesthesia: spinal  Prosthesis: Depuy Acetabulum: Pinnacle 52 mm Femur: Corail KA 10 Head: 36 mm size: +1.5 Liner: neutral Bearing Type: Ceramic on poly  Date of Service: 09/16/2015  Total Hip Arthroplasty (Anterior Approach) Op Note:  After informed consent was obtained and the operative extremity marked in the holding area, the patient was brought back to the operating room and placed supine on the HANA table. Next, the operative extremity was prepped and draped in normal sterile fashion. Surgical timeout occurred verifying patient identification, surgical site, surgical procedure and administration of antibiotics.  A modified anterior Smith-Peterson approach to the hip was performed, using the interval between tensor fascia lata and sartorius.  Dissection was carried bluntly down onto the anterior hip capsule. The lateral femoral circumflex vessels were identified and coagulated. A capsulotomy was performed and the capsular flaps tagged for later repair.  Fluoroscopy was utilized to prepare for the femoral neck cut. The neck osteotomy was performed. The femoral head was removed, the acetabular rim was cleared of soft tissue and attention was turned to reaming the acetabulum.  Sequential reaming was performed under fluoroscopic guidance. We reamed to a size 51 mm, and then impacted the acetabular shell. The liner was then placed after irrigation and attention turned to the femur.  After placing the femoral hook, the leg was taken to externally rotated, extended and adducted position taking care to perform soft tissue releases to allow for adequate mobilization of the femur. Soft tissue was cleared from  the shoulder of the greater trochanter and the hook elevator used to improve exposure of the proximal femur. Sequential broaching performed up to a size 10. Trial neck and head were placed. The leg was brought back up to neutral and the construct reduced. The position and sizing of components, offset and leg lengths were checked using fluoroscopy. Stability of the construct was checked in extension and external rotation without any subluxation or impingement of prosthesis. We dislocated the prosthesis, dropped the leg back into position, removed trial components, and irrigated copiously. The final stem and head was then placed, the leg brought back up, the system reduced and fluoroscopy used to verify positioning.  We irrigated, obtained hemostasis and closed the capsule using #2 ethibond suture.  Dilute betadyne solution was used. The fascia was closed with #1 vicryl plus, the deep fat layer was closed with 0 vicryl, the subcutaneous layers closed with 2.0 Vicryl Plus and the skin closed with 4.0 monocryl and steri strips. A sterile dressing was applied. The patient was awakened in the operating room and taken to recovery in stable condition.  All sponge, needle, and instrument counts were correct at the end of the case.   Position: supine  Complications: none.  Time Out: performed   Drains/Packing: none  Estimated blood loss: 200 cc  Returned to Recovery Room: in good condition.   Antibiotics: yes   Mechanical VTE (DVT) Prophylaxis: sequential compression devices, TED thigh-high  Chemical VTE (DVT) Prophylaxis: lovenox   Fluid Replacement: see anesthesia record  Specimens Removed: 1 to pathology   Sponge and Instrument Count Correct? yes   PACU: portable radiograph - low AP   Admission: inpatient status, start PT & OT  POD#1  Plan/RTC: Return in 2 weeks for staple removal. Return in 6 weeks to see MD.  Weight Bearing/Load Lower Extremity: full  Hip precautions: none Suture Removal:  10-14 days  Betadine to incision twice daily once dressing is removed on POD#7  N. Glee Arvin, MD [facility] [phone] 2:44 PM      Implant Name Type Inv. Item Serial No. Manufacturer Lot No. LRB No. Used  LINER ACETAB NEUTRAL 36ID 520D - [id number] Liner LINER ACETAB NEUTRAL 36ID 520D  DEPUY [id number] Left 1  PIN SECTOR W/GRIP ACE CUP - [id number] Hips PIN SECTOR W/GRIP ACE CUP  DEPUY [id number] Left 1  STEM CORAIL KA10 - [id number] Stem STEM CORAIL KA10  DEPUY [id number] Left 1  HEAD CERAMIC DELTA 36 PLUS 1.5 - [id number] Hips HEAD CERAMIC DELTA 36 PLUS 1.5   DEPUY [id number] Left 1

## 2015-09-16 NOTE — H&P (Signed)
    PREOPERATIVE H&P  Chief Complaint: Left hip osteoarthritis  HPI: Taylor Short is a 60 y.o. female who presents for surgical treatment of Left hip osteoarthritis.  She denies any changes in medical history.  Past Medical History:  Diagnosis Date  . Anemia   . Arthritis   . Asthma   . History of bronchitis   . History of pneumonia   . Hypertension   . Migraine    Past Surgical History:  Procedure Laterality Date  . CHOLECYSTECTOMY    . ESOPHAGOGASTRODUODENOSCOPY    . EYE SURGERY     glacuma   Social History   Social History  . Marital status: Divorced    Spouse name: N/A  . Number of children: N/A  . Years of education: N/A   Social History Main Topics  . Smoking status: Never Smoker  . Smokeless tobacco: Not on file  . Alcohol use No  . Drug use: No  . Sexual activity: Not on file   Other Topics Concern  . Not on file   Social History Narrative  . No narrative on file   No family history on file. Allergies  Allergen Reactions  . Aspirin Other (See Comments)    Nose bleeds  . Other Other (See Comments)    Ingredients in EPIDURALS  . Codeine Other (See Comments)    Hallucinations   Prior to Admission medications   Medication Sig Start Date End Date Taking? Authorizing Provider  acetaminophen (TYLENOL) 325 MG tablet Take 650 mg by mouth every 6 (six) hours as needed for mild pain.   Yes Historical Provider, MD  gabapentin (NEURONTIN) 300 MG capsule Take 600 mg by mouth daily.   Yes Historical Provider, MD  meloxicam (MOBIC) 7.5 MG tablet Take 7.5 mg by mouth daily. 08/19/15  Yes Historical Provider, MD  albuterol (PROVENTIL HFA;VENTOLIN HFA) 108 (90 Base) MCG/ACT inhaler Inhale 2 puffs into the lungs as needed for wheezing or shortness of breath.    Historical Provider, MD     Positive ROS: All other systems have been reviewed and were otherwise negative with the exception of those mentioned in the HPI and as above.  Physical Exam: General: Alert,  no acute distress Cardiovascular: No pedal edema Respiratory: No cyanosis, no use of accessory musculature GI: abdomen soft Skin: No lesions in the area of chief complaint Neurologic: Sensation intact distally Psychiatric: Patient is competent for consent with normal mood and affect Lymphatic: no lymphedema  MUSCULOSKELETAL: exam stable  Assessment: Left hip osteoarthritis  Plan: Plan for Procedure(s): LEFT TOTAL HIP ARTHROPLASTY ANTERIOR APPROACH  The risks benefits and alternatives were discussed with the patient including but not limited to the risks of nonoperative treatment, versus surgical intervention including infection, bleeding, nerve injury,  blood clots, cardiopulmonary complications, morbidity, mortality, among others, and they were willing to proceed.   Cheral Almas, MD   09/16/2015 11:55 AM

## 2015-09-17 ENCOUNTER — Encounter (HOSPITAL_COMMUNITY): Payer: Self-pay | Admitting: Orthopaedic Surgery

## 2015-09-17 LAB — BASIC METABOLIC PANEL
Anion gap: 6 (ref 5–15)
BUN: 8 mg/dL (ref 6–20)
CHLORIDE: 107 mmol/L (ref 101–111)
CO2: 26 mmol/L (ref 22–32)
Calcium: 8.5 mg/dL — ABNORMAL LOW (ref 8.9–10.3)
Creatinine, Ser: 0.61 mg/dL (ref 0.44–1.00)
GFR calc Af Amer: >60 mL/min (ref >60)
GFR calc non Af Amer: >60 mL/min (ref >60)
GLUCOSE: 124 mg/dL — AB (ref 65–99)
POTASSIUM: 3.8 mmol/L (ref 3.5–5.1)
Sodium: 139 mmol/L (ref 135–145)

## 2015-09-17 LAB — CBC
HEMATOCRIT: 30.9 % — AB (ref 36.0–46.0)
HEMOGLOBIN: 10 g/dL — AB (ref 12.0–15.0)
MCH: 29.9 pg (ref 26.0–34.0)
MCHC: 32.4 g/dL (ref 30.0–36.0)
MCV: 92.2 fL (ref 78.0–100.0)
Platelets: 184 10*3/uL (ref 150–400)
RBC: 3.35 MIL/uL — AB (ref 3.87–5.11)
RDW: 13.6 % (ref 11.5–15.5)
WBC: 5.6 10*3/uL (ref 4.0–10.5)

## 2015-09-17 MED ORDER — BUTALBITAL-APAP-CAFFEINE 50-325-40 MG PO TABS: 1.0000 | ORAL_TABLET | ORAL | Status: DC | PRN

## 2015-09-17 NOTE — Evaluation (Signed)
Occupational Therapy Evaluation and Discharge Patient Details Name: Taylor Short MRN: [id number] DOB: Jul 18, 1955 Today's Date: 09/17/2015    History of Present Illness Pt is a pleasant 60 y/o female s/p L THA (anterior approach, no precautions). PMH including but not limited to HTN.   Clinical Impression   Pt was independent prior to admission, working as a Scientist, water quality. Pt presents with post operative pain, generalized weakness and impaired standing balance interfering with ability to perform at her baseline. Pt educated in multiple uses of 3 in 1, compensatory strategies for LB ADL and use of AE, safe footwear and transporting items safely with RW. Pt will have 24 hour assist at home. No further OT needs.  Follow Up Recommendations  No OT follow up    Equipment Recommendations  3 in 1 bedside comode    Recommendations for Other Services       Precautions / Restrictions Precautions Precautions: Fall Restrictions Weight Bearing Restrictions: Yes LLE Weight Bearing: Weight bearing as tolerated      Mobility Bed Mobility               General bed mobility comments: pt in chair  Transfers Overall transfer level: Needs assistance Equipment used: Rolling walker (2 wheeled) Transfers: Sit to/from Stand Sit to Stand: Min guard         General transfer comment: increased time, reminders for technique    Balance                                            ADL Overall ADL's : Needs assistance/impaired Eating/Feeding: Independent;Sitting   Grooming: Wash/dry hands;Standing;Min guard   Upper Body Bathing: Set up;Sitting   Lower Body Bathing: Minimal assistance;Sit to/from stand Lower Body Bathing Details (indicate cue type and reason): recommended long bath sponge Upper Body Dressing : Set up;Sitting   Lower Body Dressing: Sit to/from stand;Minimal assistance Lower Body Dressing Details (indicate cue type and reason): educated in  compensatory strategies, use of reacher, safe footwear Toilet Transfer: Min guard;RW;Ambulation;BSC   Toileting- Clothing Manipulation and Hygiene: Minimal assistance;Sit to/from Nurse, children's Details (indicate cue type and reason): recommended pt practice with HHPT as she has a specific type of tub  Functional mobility during ADLs: Min guard;Rolling walker       Vision     Perception     Praxis      Pertinent Vitals/Pain Pain Assessment: Faces Faces Pain Scale: Hurts little more Pain Location: L hip Pain Descriptors / Indicators: Sore;Guarding Pain Intervention(s): Monitored during session;Premedicated before session;Repositioned;Ice applied     Hand Dominance Right   Extremity/Trunk Assessment Upper Extremity Assessment Upper Extremity Assessment: Overall WFL for tasks assessed   Lower Extremity Assessment Lower Extremity Assessment: Defer to PT evaluation       Communication Communication Communication: No difficulties   Cognition Arousal/Alertness: Awake/alert Behavior During Therapy: WFL for tasks assessed/performed Overall Cognitive Status: Within Functional Limits for tasks assessed                     General Comments       Exercises       Shoulder Instructions      Home Living Family/patient expects to be discharged to:: Private residence Living Arrangements: Alone Available Help at Discharge: Family;Available 24 hours/day (daughter and son will care for her) Type of Home:  House Home Access: Level entry     Home Layout: One level     Bathroom Shower/Tub: Tub/shower unit (garden tub with high wall)   Bathroom Toilet: Standard     Home Equipment: Walker - standard;Shower seat;Toilet riser (walker bag)          Prior Functioning/Environment Level of Independence: Independent        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, broadcasting/film/video    OT Diagnosis: Generalized weakness;Acute pain   OT Problem List:     OT  Treatment/Interventions:      OT Goals(Current goals can be found in the care plan section) Acute Rehab OT Goals Patient Stated Goal: return home  OT Frequency:     Barriers to D/C:            Co-evaluation              End of Session Equipment Utilized During Treatment: Gait belt;Rolling walker  Activity Tolerance: Patient tolerated treatment well Patient left: in chair;with call bell/phone within reach;with family/visitor present   Time: 3299-2426 OT Time Calculation (min): 19 min Charges:  OT General Charges $OT Visit: 1 Procedure OT Evaluation $OT Eval Low Complexity: 1 Procedure G-Codes:    Evern Bio 09/17/2015, 4:05 PM  [phone]

## 2015-09-17 NOTE — Evaluation (Signed)
Physical Therapy Evaluation Patient Details Name: Taylor Short MRN: [id number] DOB: August 07, 1955 Today's Date: 09/17/2015   History of Present Illness  Pt is a pleasant 60 y/o female s/p L THA (anterior approach, no precautions). PMH including but not limited to HTN.  Clinical Impression  Pt presented supine in bed with HOB elevated and daughter present in room. Pt reported that she has had some nausea this morning likely due to medications. Pt's daughter stated that she lives in [place], but will be staying with her mother during her recovery and available 24/7 to assist. Pt was able to perform all functional mobility with min guard for safety only, no physical assist needed. Pt would continue to benefit from skilled physical therapy services at this time while admitted and after d/c to address her below listed limitations in order to improve her overall safety and independence with functional mobility.      Follow Up Recommendations Home health PT;Supervision for mobility/OOB    Equipment Recommendations  Rolling walker with 5" wheels    Recommendations for Other Services       Precautions / Restrictions Precautions Precautions: Fall Restrictions Weight Bearing Restrictions: Yes LLE Weight Bearing: Weight bearing as tolerated      Mobility  Bed Mobility Overal bed mobility: Needs Assistance Bed Mobility: Supine to Sit     Supine to sit: Min guard;HOB elevated     General bed mobility comments: Pt required increased time to complete task and occasional VC's for technique  Transfers Overall transfer level: Needs assistance   Transfers: Sit to/from Stand Sit to Stand: Min guard         General transfer comment: Pt required increased time and VC'ing for bilateral hand positioning and technique  Ambulation/Gait Ambulation/Gait assistance: Min guard Ambulation Distance (Feet): 15 Feet Assistive device: Rolling walker (2 wheeled) Gait Pattern/deviations: Step-to  pattern;Decreased step length - right;Decreased stance time - left;Decreased weight shift to left Gait velocity: decreased Gait velocity interpretation: Below normal speed for age/gender    Stairs            Wheelchair Mobility    Modified Rankin (Stroke Patients Only)       Balance Overall balance assessment: Needs assistance Sitting-balance support: No upper extremity supported;Feet supported Sitting balance-Leahy Scale: Fair     Standing balance support: Bilateral upper extremity supported;During functional activity Standing balance-Leahy Scale: Poor                               Pertinent Vitals/Pain Pain Assessment: 0-10 Pain Score: 4  Pain Location: L hip Pain Descriptors / Indicators: Discomfort;Operative site guarding Pain Intervention(s): Limited activity within patient's tolerance;Monitored during session;Repositioned;Ice applied    Home Living Family/patient expects to be discharged to:: Private residence Living Arrangements: Alone Available Help at Discharge: Family;Available 24 hours/day (daughter and son are in town to care for her temporarily) Type of Home: House Home Access: Level entry     Home Layout: One level Home Equipment: Walker - standard;Bedside commode;Shower seat      Prior Function Level of Independence: Independent               Hand Dominance        Extremity/Trunk Assessment   Upper Extremity Assessment: Overall WFL for tasks assessed           Lower Extremity Assessment: LLE deficits/detail   LLE Deficits / Details: Pt with decreased strength and ROM limitations secondary to  post-op. Sensation grossly intact.     Communication   Communication: No difficulties  Cognition Arousal/Alertness: Awake/alert Behavior During Therapy: WFL for tasks assessed/performed Overall Cognitive Status: Within Functional Limits for tasks assessed                      General Comments      Exercises  Total Joint Exercises Ankle Circles/Pumps: AROM;Strengthening;Left;10 reps;Seated Hip ABduction/ADduction: AROM;Strengthening;Left;10 reps;Seated Marching in Standing: AROM;Strengthening;Both;10 reps;Seated      Assessment/Plan    PT Assessment Patient needs continued PT services  PT Diagnosis Difficulty walking   PT Problem List Decreased strength;Decreased range of motion;Decreased activity tolerance;Decreased balance;Decreased mobility;Decreased coordination;Decreased knowledge of use of DME;Pain  PT Treatment Interventions DME instruction;Gait training;Stair training;Functional mobility training;Therapeutic activities;Therapeutic exercise;Balance training;Neuromuscular re-education;Patient/family education   PT Goals (Current goals can be found in the Care Plan section) Acute Rehab PT Goals Patient Stated Goal: return home PT Goal Formulation: With patient Time For Goal Achievement: 09/24/15 Potential to Achieve Goals: Good    Frequency 7X/week   Barriers to discharge        Co-evaluation               End of Session Equipment Utilized During Treatment: Gait belt Activity Tolerance: Patient limited by fatigue;Patient limited by pain Patient left: in chair;with call bell/phone within reach;with family/visitor present Nurse Communication: Mobility status         Time: 3474-2595 PT Time Calculation (min) (ACUTE ONLY): 31 min   Charges:   PT Evaluation $PT Eval Moderate Complexity: 1 Procedure PT Treatments $Gait Training: 8-22 mins   PT G CodesAlessandra Short Taylor Short 09/17/2015, 11:20 AM Taylor Short, PT, DPT [phone]

## 2015-09-17 NOTE — Progress Notes (Signed)
   Subjective:  Patient reports pain as moderate.  Nausea o/n.    Objective:   VITALS:   Vitals:   09/16/15 1736 09/16/15 2001 09/17/15 0100 09/17/15 0300  BP: (!) 159/73 (!) 165/84 113/69 110/63  Pulse: 78 80 68 62  Resp: 18 16 16 16   Temp: 98 F (36.7 C) 98.4 F (36.9 C) 98 F (36.7 C) 97.9 F (36.6 C)  TempSrc: Oral Oral Oral Oral  SpO2: 99% 100% 95% 97%  Weight:      Height:        Neurologically intact Neurovascular intact Sensation intact distally Intact pulses distally Dorsiflexion/Plantar flexion intact Incision: dressing C/D/I and no drainage No cellulitis present Compartment soft   Lab Results  Component Value Date   WBC 5.6 09/17/2015   HGB 10.0 (L) 09/17/2015   HCT 30.9 (L) 09/17/2015   MCV 92.2 09/17/2015   PLT 184 09/17/2015     Assessment/Plan:  1 Day Post-Op   - Expected postop acute blood loss anemia - will monitor for symptoms - Up with PT/OT - DVT ppx - SCDs, ambulation, lovenox - WBAT operative extremity - Pain control - Discharge planning - anticipate home sat  Cheral Almas 09/17/2015, 7:10 AM [phone]

## 2015-09-17 NOTE — Anesthesia Postprocedure Evaluation (Signed)
Anesthesia Post Note  Patient: Taylor Short  Procedure(s) Performed: Procedure(s) (LRB): LEFT TOTAL HIP ARTHROPLASTY ANTERIOR APPROACH (Left)  Patient location during evaluation: PACU Anesthesia Type: Spinal Level of consciousness: awake Pain management: satisfactory to patient Vital Signs Assessment: post-procedure vital signs reviewed and stable Respiratory status: spontaneous breathing Cardiovascular status: blood pressure returned to baseline Postop Assessment: no headache and spinal receding Anesthetic complications: no     Last Vitals:  Vitals:   09/17/15 0910 09/17/15 1335  BP: (!) 144/77 (!) 179/75  Pulse: 73   Resp: 18 16  Temp: 36.7 C 36.8 C    Last Pain:  Vitals:   09/17/15 1346  TempSrc:   PainSc: 5    Pain Goal: Patients Stated Pain Goal: 0 (09/17/15 1346)               Edsel Shives EDWARD

## 2015-09-17 NOTE — Care Management Note (Signed)
Case Management Note  Patient Details  Name: Taylor Short MRN: [id number] Date of Birth: [birth year]  Subjective/Objective:   60 yr old female s/p left total hip arthroplasty.                 Action/Plan: Case manager spoke with patient concerning home health and DME needs. Choice was offered for Home Health Agency. Referral was called to Beatriz Stallion, [facility]. Patient states she has 3in1. Will have family support at discharge.    Expected Discharge Date:   09/18/15               Expected Discharge Plan:  Home w Home Health Services  In-House Referral:  NA  Discharge planning Services  CM Consult  Post Acute Care Choice:  Durable Medical Equipment, Home Health Choice offered to:  Patient  DME Arranged:  Walker rolling DME Agency:  [facility]  HH Arranged:  PT [facility] Agency:  [facility]  Status of Service:  Completed, signed off  If discussed at Long Length of Stay Meetings, dates discussed:    Additional Comments:  Durenda Guthrie, RN 09/17/2015, 12:14 PM

## 2015-09-17 NOTE — Progress Notes (Signed)
Physical Therapy Treatment Patient Details Name: Taylor Short MRN: [id number] DOB: 12-Jun-1955 Today's Date: 09/17/2015    History of Present Illness Pt is a pleasant 60 y/o female s/p L THA (anterior approach, no precautions). PMH including but not limited to HTN.    PT Comments    Pt presented sitting OOB in recliner when PT entered room. Pt making good progress towards her goals and increasing distance ambulated. Pt would continue to benefit from skilled physical therapy services at this time while admitted and after d/c to address her limitations in order to improve her overall safety and independence with functional mobility.    Follow Up Recommendations  Home health PT;Supervision for mobility/OOB     Equipment Recommendations  Rolling walker with 5" wheels    Recommendations for Other Services       Precautions / Restrictions Precautions Precautions: Fall Restrictions Weight Bearing Restrictions: Yes LLE Weight Bearing: Weight bearing as tolerated    Mobility  Bed Mobility               General bed mobility comments: Pt was sitting OOB in recliner when PT arrived.  Transfers Overall transfer level: Needs assistance Equipment used: Rolling walker (2 wheeled) Transfers: Sit to/from Stand Sit to Stand: Min guard         General transfer comment: Pt required increased time and VC'ing for bilateral hand positioning and technique  Ambulation/Gait Ambulation/Gait assistance: Min guard Ambulation Distance (Feet): 40 Feet Assistive device: Rolling walker (2 wheeled) Gait Pattern/deviations: Step-to pattern;Decreased step length - right;Decreased stance time - left;Decreased weight shift to left Gait velocity: decreased Gait velocity interpretation: Below normal speed for age/gender     Stairs            Wheelchair Mobility    Modified Rankin (Stroke Patients Only)       Balance Overall balance assessment: Needs assistance Sitting-balance  support: Feet supported;No upper extremity supported Sitting balance-Leahy Scale: Fair     Standing balance support: Bilateral upper extremity supported;During functional activity Standing balance-Leahy Scale: Poor                      Cognition Arousal/Alertness: Awake/alert Behavior During Therapy: WFL for tasks assessed/performed Overall Cognitive Status: Within Functional Limits for tasks assessed                      Exercises Total Joint Exercises Ankle Circles/Pumps: AROM;Strengthening;Left;10 reps;Seated Heel Slides: AROM;Strengthening;Left;10 reps Hip ABduction/ADduction: AROM;Strengthening;Left;10 reps;Seated Straight Leg Raises: AROM;Strengthening;Left;10 reps;Seated    General Comments        Pertinent Vitals/Pain Pain Assessment: Faces Faces Pain Scale: Hurts a little bit Pain Location: L hip Pain Descriptors / Indicators: Discomfort;Guarding Pain Intervention(s): Limited activity within patient's tolerance;Monitored during session;Repositioned;Ice applied    Home Living Family/patient expects to be discharged to:: Private residence Living Arrangements: Alone Available Help at Discharge: Family;Available 24 hours/day (daughter and son will care for her) Type of Home: House Home Access: Level entry   Home Layout: One level Home Equipment: Walker - standard;Shower seat;Toilet riser (walker bag)      Prior Function Level of Independence: Independent      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, broadcasting/film/video   PT Goals (current goals can now be found in the care plan section) Acute Rehab PT Goals Patient Stated Goal: return home PT Goal Formulation: With patient Time For Goal Achievement: 09/24/15 Potential to Achieve Goals: Good Progress towards PT goals: Progressing toward goals  Frequency  7X/week    PT Plan Current plan remains appropriate    Co-evaluation             End of Session Equipment Utilized During Treatment: Gait belt Activity  Tolerance: Patient limited by fatigue;Patient limited by pain Patient left: in chair;with call bell/phone within reach;with family/visitor present     Time: 3382-5053 PT Time Calculation (min) (ACUTE ONLY): 17 min  Charges:  $Gait Training: 8-22 mins                    G CodesAlessandra Bevels [place] Short 10-01-15, 4:08 PM Deborah Chalk, PT, DPT [phone]

## 2015-09-17 NOTE — Progress Notes (Signed)
Pt informed RN she take fioricet at home for her migraines and will like to have it ordered when needed. MD notified and new order received. Dionne Bucy RN

## 2015-09-18 MED ORDER — CELECOXIB 200 MG PO CAPS: 200.0000 mg | ORAL_CAPSULE | Freq: Two times a day (BID) | ORAL | 0 refills | Status: DC

## 2015-09-18 MED ORDER — GABAPENTIN 300 MG PO CAPS: 600.0000 mg | ORAL_CAPSULE | Freq: Every day | ORAL | Status: DC

## 2015-09-18 MED ORDER — LISINOPRIL 10 MG PO TABS
10.0000 mg | ORAL_TABLET | Freq: Every morning | ORAL | Status: DC
  Administered 2015-09-18: 10 mg via ORAL
  Filled 2015-09-18: qty 1

## 2015-09-18 NOTE — Progress Notes (Signed)
Subjective: 2 Days Post-Op Procedure(s) (LRB): LEFT TOTAL HIP ARTHROPLASTY ANTERIOR APPROACH (Left) Patient reports pain as moderate.  Has been up with therapy, but slowed by nausea.  Only just tolerating a little diet.  Wants to stay one more day due to this.  Objective: Vital signs in last 24 hours: Temp:  [97.5 F (36.4 C)-98.4 F (36.9 C)] 97.5 F (36.4 C) (07/29 0529) Pulse Rate:  [58-73] 58 (07/29 0529) Resp:  [16-18] 16 (07/29 0529) BP: (144-179)/(75-88) 146/88 (07/29 0529) SpO2:  [94 %-98 %] 97 % (07/29 0529)  Intake/Output from previous day: 07/28 0701 - 07/29 0700 In: 2911.7 [P.O.:720; I.V.:2191.7] Out: -  Intake/Output this shift: No intake/output data recorded.   Recent Labs  09/16/15 0954 09/16/15 1800 09/17/15 0414  HGB 13.8 11.5* 10.0*    Recent Labs  09/16/15 1800 09/17/15 0414  WBC 10.8* 5.6  RBC 3.93 3.35*  HCT 36.7 30.9*  PLT 210 184    Recent Labs  09/16/15 1800 09/17/15 0414  NA  --  139  K  --  3.8  CL  --  107  CO2  --  26  BUN  --  8  CREATININE 0.58 0.61  GLUCOSE  --  124*  CALCIUM  --  8.5*   No results for input(s): LABPT, INR in the last 72 hours.  Sensation intact distally Intact pulses distally Dorsiflexion/Plantar flexion intact Incision: dressing C/D/I  Assessment/Plan: 2 Days Post-Op Procedure(s) (LRB): LEFT TOTAL HIP ARTHROPLASTY ANTERIOR APPROACH (Left) Up with therapy Plan for discharge tomorrow Discharge home with home health  Kathryne Hitch 09/18/2015, 9:04 AM

## 2015-09-18 NOTE — Progress Notes (Signed)
Physical Therapy Treatment Patient Details Name: Taylor Short MRN: [id number] DOB: 1955-11-11 Today's Date: 09/18/2015    History of Present Illness Pt is a pleasant 60 y/o female s/p L THA (anterior approach, no precautions). PMH including but not limited to HTN.    PT Comments    Pt reports she would like to perform stair training so she can be prepared when visiting her sister.  PTA instructed with multiple methods.  Sister and daughter present to observe.  PT ready for d/c home from a mobility standpoint but awaiting d/c order. Most likely will d/c late pm or in am.    Follow Up Recommendations  Home health PT;Supervision for mobility/OOB     Equipment Recommendations  Rolling walker with 5" wheels;3in1 (PT)    Recommendations for Other Services       Precautions / Restrictions Precautions Precautions: Fall Restrictions Weight Bearing Restrictions: Yes LLE Weight Bearing: Weight bearing as tolerated    Mobility  Bed Mobility               General bed mobility comments: Pt received in recliner on arrival.    Transfers Overall transfer level: Needs assistance Equipment used: Rolling walker (2 wheeled) Transfers: Sit to/from Stand Sit to Stand: Modified independent (Device/Increase time)         General transfer comment: Better technique, improved eccentric loading.   Ambulation/Gait Ambulation/Gait assistance: Supervision Ambulation Distance (Feet): 86 Feet Assistive device: Rolling walker (2 wheeled) Gait Pattern/deviations: Step-through pattern;Decreased stance time - left;Trunk flexed Gait velocity: decreased   General Gait Details: Cues for forward gaze and gait symmetry.     Stairs Stairs: Yes Stairs assistance: Min assist Stair Management: No rails;One rail Right;Step to pattern;Forwards;Backwards Number of Stairs: 9 (x5 with R rail, x3 with RW, and curb training with RW x1.  ) General stair comments: Cues for sequencing, hand placement and  RW placement.  Pt performed forward to ascend with R rail and backwards to descend with R rail.  Pt performed trials with RW backwards to ascend and forwards to descend.  Wheelchair Mobility    Modified Rankin (Stroke Patients Only)       Balance Overall balance assessment: Needs assistance   Sitting balance-Leahy Scale: Fair       Standing balance-Leahy Scale: Poor                      Cognition Arousal/Alertness: Awake/alert Behavior During Therapy: WFL for tasks assessed/performed Overall Cognitive Status: Within Functional Limits for tasks assessed                      Exercises    General Comments        Pertinent Vitals/Pain Pain Assessment: 0-10 Pain Score: 5  Faces Pain Scale: Hurts a little bit Pain Location: L hip Pain Descriptors / Indicators: Tightness Pain Intervention(s): Monitored during session;Ice applied    Home Living                      Prior Function            PT Goals (current goals can now be found in the care plan section) Acute Rehab PT Goals Patient Stated Goal: return home Potential to Achieve Goals: Good Progress towards PT goals: Progressing toward goals    Frequency  7X/week    PT Plan Current plan remains appropriate    Co-evaluation  End of Session Equipment Utilized During Treatment: Gait belt Activity Tolerance: Patient limited by fatigue;Patient limited by pain Patient left: in chair;with call bell/phone within reach;with family/visitor present     Time: 8295-6213 PT Time Calculation (min) (ACUTE ONLY): 20 min  Charges:  $Gait Training: 8-22 mins                    G Codes:      Florestine Avers 10-06-2015, 2:49 PM  Joycelyn Rua, PTA pager [phone]

## 2015-09-18 NOTE — Progress Notes (Signed)
Physical Therapy Treatment Patient Details Name: Taylor Short MRN: [id number] DOB: 1956/02/16 Today's Date: 09/18/2015    History of Present Illness Pt is a pleasant 61 y/o female s/p L THA (anterior approach, no precautions). PMH including but not limited to HTN.    PT Comments    Pt performed completed HEP, issued HEP and ready for d/c home at this time.  Informed nurse.    Follow Up Recommendations  Home health PT;Supervision for mobility/OOB     Equipment Recommendations  Rolling walker with 5" wheels    Recommendations for Other Services       Precautions / Restrictions Precautions Precautions: Fall Restrictions Weight Bearing Restrictions: Yes LLE Weight Bearing: Weight bearing as tolerated    Mobility  Bed Mobility               General bed mobility comments: Pt received in recliner on arrival.    Transfers Overall transfer level: Needs assistance Equipment used: Rolling walker (2 wheeled) Transfers: Sit to/from Stand Sit to Stand: Supervision         General transfer comment: Pt required increased time and VC'ing for bilateral hand positioning and technique.  Poor eccentric loading from stand to sit.    Ambulation/Gait Ambulation/Gait assistance: Supervision Ambulation Distance (Feet): 250 Feet Assistive device: Rolling walker (2 wheeled) Gait Pattern/deviations: Step-through pattern;Decreased stance time - left;Decreased step length - right;Trunk flexed     General Gait Details: Cues for forward gaze and gait symmetry.     Stairs Stairs: Yes (Pt denies stairs at home.  )          Wheelchair Mobility    Modified Rankin (Stroke Patients Only)       Balance Overall balance assessment: Needs assistance   Sitting balance-Leahy Scale: Fair       Standing balance-Leahy Scale: Poor                      Cognition Arousal/Alertness: Awake/alert Behavior During Therapy: WFL for tasks assessed/performed Overall Cognitive  Status: Within Functional Limits for tasks assessed                      Exercises Total Joint Exercises Ankle Circles/Pumps: AROM;10 reps;Seated;Both Quad Sets: AROM;Left;10 reps;Supine Short Arc Quad: AROM;Left;10 reps;Supine Heel Slides: AROM;Left;10 reps;Supine Hip ABduction/ADduction: Left;Supine;AAROM;AROM;20 reps;Standing (1x10 AROM in standing and 1x10 AAROM in supine.  ) Long Arc Quad: AROM;Left;10 reps;Seated Knee Flexion: AROM;Left;10 reps;Standing Marching in Standing: AROM;Left;10 reps;Standing Standing Hip Extension: AROM;Left;10 reps;Standing    General Comments        Pertinent Vitals/Pain Pain Assessment: 0-10 Pain Score: 5  Pain Location: L hip Pain Descriptors / Indicators: Burning;Tightness Pain Intervention(s): Monitored during session;Ice applied    Home Living                      Prior Function            PT Goals (current goals can now be found in the care plan section) Acute Rehab PT Goals Patient Stated Goal: return home Potential to Achieve Goals: Good Progress towards PT goals: Progressing toward goals    Frequency  7X/week    PT Plan Current plan remains appropriate    Co-evaluation             End of Session Equipment Utilized During Treatment: Gait belt Activity Tolerance: Patient limited by fatigue;Patient limited by pain Patient left: in chair;with call bell/phone within reach;with family/visitor present  Time: 1136-1206 PT Time Calculation (min) (ACUTE ONLY): 30 min  Charges:  $Gait Training: 8-22 mins $Therapeutic Exercise: 8-22 mins                    G Codes:      Florestine Avers Sep 20, 2015, 12:26 PM  Joycelyn Rua, PTA pager [phone]

## 2015-09-18 NOTE — Discharge Summary (Signed)
Patient ID: Taylor Short MRN: [id number] DOB/AGE: 59-Aug-1957 59 y.o.  Admit date: 09/16/2015 Discharge date: 09/18/2015  Admission Diagnoses:  Active Problems:   History of total left hip replacement   Hip joint replacement status   Discharge Diagnoses:  Same  Past Medical History:  Diagnosis Date  . Anemia   . Arthritis    "left hip" (09/16/2015)  . Asthma   . Chronic bronchitis (HCC)    "although I haven't had it in the last couple years" (09/16/2015)  . Family history of adverse reaction to anesthesia    "dad's BP drops; brother has hard time waking up" (09/16/2015)  . History of gout   . Hypertension   . Migraine    "I pretty much keep a migraine; really bad probably once/month" (09/16/2015)  . Pneumonia "several times"    Surgeries: Procedure(s): LEFT TOTAL HIP ARTHROPLASTY ANTERIOR APPROACH on 09/16/2015   Consultants:   Discharged Condition: Improved  Hospital Course: Taylor Short is an 60 y.o. female who was admitted 09/16/2015 for operative treatment of<principal problem not specified>. Patient has severe unremitting pain that affects sleep, daily activities, and work/hobbies. After pre-op clearance the patient was taken to the operating room on 09/16/2015 and underwent  Procedure(s): LEFT TOTAL HIP ARTHROPLASTY ANTERIOR APPROACH.    Patient was given perioperative antibiotics: Anti-infectives    Start     Dose/Rate Route Frequency Ordered Stop   09/16/15 1800  ceFAZolin (ANCEF) IVPB 2g/100 mL premix     2 g 200 mL/hr over 30 Minutes Intravenous Every 6 hours 09/16/15 1726 09/16/15 2328   09/16/15 1200  ceFAZolin (ANCEF) IVPB 2g/100 mL premix     2 g 200 mL/hr over 30 Minutes Intravenous To [facility]Stay [facility] 09/15/15 1320 09/16/15 1250       Patient was given sequential compression devices, early ambulation, and chemoprophylaxis to prevent DVT.  Patient benefited maximally from hospital stay and there were no complications.    Recent vital signs: Patient  Vitals for the past 24 hrs:  BP Temp Temp src Pulse Resp SpO2  09/18/15 1300 (!) 144/80 97.1 F (36.2 C) Oral 64 18 98 %  09/18/15 0529 (!) 146/88 97.5 F (36.4 C) Oral (!) 58 16 97 %  09/17/15 1945 (!) 167/81 98.4 F (36.9 C) Oral 64 16 96 %     Recent laboratory studies:  Recent Labs  09/16/15 1800 09/17/15 0414  WBC 10.8* 5.6  HGB 11.5* 10.0*  HCT 36.7 30.9*  PLT 210 184  NA  --  139  K  --  3.8  CL  --  107  CO2  --  26  BUN  --  8  CREATININE 0.58 0.61  GLUCOSE  --  124*  CALCIUM  --  8.5*     Discharge Medications:     Medication List    TAKE these medications   acetaminophen 325 MG tablet Commonly known as:  TYLENOL Take 650 mg by mouth every 6 (six) hours as needed for mild pain.   albuterol 108 (90 Base) MCG/ACT inhaler Commonly known as:  PROVENTIL HFA;VENTOLIN HFA Inhale 2 puffs into the lungs as needed for wheezing or shortness of breath.   celecoxib 200 MG capsule Commonly known as:  CELEBREX Take 1 capsule (200 mg total) by mouth every 12 (twelve) hours.   enoxaparin 40 MG/0.4ML injection Commonly known as:  LOVENOX Inject 0.4 mLs (40 mg total) into the skin daily.   gabapentin 300 MG capsule Commonly known as:  NEURONTIN Take 600 mg by mouth daily.   HYDROmorphone 2 MG tablet Commonly known as:  DILAUDID Take 1 tablet (2 mg total) by mouth every 4 (four) hours as needed for severe pain.   meloxicam 7.5 MG tablet Commonly known as:  MOBIC Take 7.5 mg by mouth daily.   methocarbamol 750 MG tablet Commonly known as:  ROBAXIN Take 1 tablet (750 mg total) by mouth 2 (two) times daily as needed for muscle spasms.   ondansetron 4 MG tablet Commonly known as:  ZOFRAN Take 1-2 tablets (4-8 mg total) by mouth every 8 (eight) hours as needed for nausea or vomiting.   oxyCODONE 10 mg 12 hr tablet Commonly known as:  OXYCONTIN Take 1 tablet (10 mg total) by mouth every 12 (twelve) hours.   senna-docusate 8.6-50 MG tablet Commonly known as:   SENOKOT S Take 1 tablet by mouth at bedtime as needed.       Diagnostic Studies: Dg Pelvis Portable  Result Date: 09/16/2015 CLINICAL DATA:  Status post left hip arthroplasty. EXAM: PORTABLE PELVIS 1-2 VIEWS COMPARISON:  Operative imaging obtained this same date. FINDINGS: The left hip total arthroplasty appears well seated and well aligned on the single AP view. There is no acute fracture or evidence of an operative complication. IMPRESSION: Well-positioned left hip total arthroplasty. Electronically Signed   By: Amie Portland M.D.   On: 09/16/2015 15:45  Dg C-arm 61-120 Min  Result Date: 09/16/2015 CLINICAL DATA:  Left hip arthroplasty. 26 seconds of fluoroscopy time. EXAM: DG C-ARM 61-120 MIN; OPERATIVE LEFT HIP WITH PELVIS COMPARISON:  None. FLUOROSCOPY TIME:  If the device does not provide the exposure index: Fluoroscopy Time:  26 seconds Number of Acquired Images:  4 FINDINGS: Submitted images show placement of a total left hip arthroplasty. The femoral and acetabular components appear well seated and aligned. There is no acute fracture or evidence of an operative complication. IMPRESSION: Well-positioned left hip arthroplasty. Electronically Signed   By: Amie Portland M.D.   On: 09/16/2015 14:26  Dg Hip Operative Unilat W Or W/o Pelvis Left  Result Date: 09/16/2015 CLINICAL DATA:  Left hip arthroplasty. 26 seconds of fluoroscopy time. EXAM: DG C-ARM 61-120 MIN; OPERATIVE LEFT HIP WITH PELVIS COMPARISON:  None. FLUOROSCOPY TIME:  If the device does not provide the exposure index: Fluoroscopy Time:  26 seconds Number of Acquired Images:  4 FINDINGS: Submitted images show placement of a total left hip arthroplasty. The femoral and acetabular components appear well seated and aligned. There is no acute fracture or evidence of an operative complication. IMPRESSION: Well-positioned left hip arthroplasty. Electronically Signed   By: Amie Portland M.D.   On: 09/16/2015 14:26   Disposition: 01-Home  or Self Care  Discharge Instructions    Discharge patient    Complete by:  As directed      Follow-up Information    Cheral Almas, MD Follow up in 2 week(s).   Specialty:  Orthopedic Surgery Why:  For suture removal, For wound re-check Contact information: [address] [place] [address] [phone]        [facility] .   Why:  Someone from [facility] will contact you to arrange start date and time for therapy. Contact information: [address] [place] [address] [phone]            Signed: Kathryne Hitch 09/18/2015, 4:00 PM

## 2015-12-13 ENCOUNTER — Ambulatory Visit (INDEPENDENT_AMBULATORY_CARE_PROVIDER_SITE_OTHER): Payer: BLUE CROSS/BLUE SHIELD | Admitting: Orthopaedic Surgery

## 2015-12-13 ENCOUNTER — Encounter (INDEPENDENT_AMBULATORY_CARE_PROVIDER_SITE_OTHER): Payer: Self-pay | Admitting: Orthopaedic Surgery

## 2015-12-13 ENCOUNTER — Ambulatory Visit (INDEPENDENT_AMBULATORY_CARE_PROVIDER_SITE_OTHER): Payer: BLUE CROSS/BLUE SHIELD

## 2015-12-13 DIAGNOSIS — M1612 Unilateral primary osteoarthritis, left hip: Secondary | ICD-10-CM

## 2015-12-13 DIAGNOSIS — Z96642 Presence of left artificial hip joint: Secondary | ICD-10-CM | POA: Diagnosis not present

## 2015-12-13 NOTE — Progress Notes (Signed)
Office Visit Note   Patient: Taylor Short           Date of Birth: August 01, 1955           MRN: [id number] Visit Date: 12/13/2015              Requested by: Deneen Harts, FNP [address] [place], [place] [address] PCP: Deneen Harts, FNP   Assessment & Plan: Visit Diagnoses:  1. Unilateral primary osteoarthritis, left hip   2. 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  This would require too much bending, squatting, crawling.   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 Wound is healed with no signs of complications or infection.  The patient does not complain of pain, and is back to normal daily activities. Radiographs reveal a total hip arthroplasty in good position, with no evidence of subsidence, loosening, or complicating features. It was reinforced that prophylactic antibiotics should be taken with any procedure including but not limited to dental work or colonoscopies.  We will plan on following up at the 6 month postop visit with radiographs at that time. As always, instructions were given to call with any questions or concerns in the interim.   Follow-Up Instructions: Return in about 3 months (around 03/14/2016) for recheck, need pelvis xray at that time.   Orders:  Orders Placed This Encounter  Procedures  . XR Pelvis 1-2 Views   No orders of the defined types were placed in this encounter.     Procedures: No procedures performed   Clinical Data: No additional  findings.   Subjective: Chief Complaint  Patient presents with  . Left Hip - Follow-up, Routine Post Op    12 wks, 4 days Post op     3 months s/p L THA.  Doing well. 2/10 pain.  Going to gym.  Finished PT.  Feels surgery has helped.    Review of Systems   Objective: Vital Signs: LMP 09/07/2010   Physical Exam  Left Hip Exam  Left hip exam is normal.      Specialty Comments:  No specialty comments available.  Imaging: No results found.   PMFS History: Patient Active Problem List   Diagnosis Date Noted  . History of total left hip replacement 09/16/2015  . Hip joint replacement status 09/16/2015   Past Medical History:  Diagnosis Date  . Anemia   . Arthritis    "left hip" (09/16/2015)  . Asthma   . Chronic bronchitis (HCC)    "although I haven't had it in the last couple years" (09/16/2015)  . Family history of adverse reaction to anesthesia    "dad's BP drops; brother has hard time waking up" (09/16/2015)  . History of gout   . Hypertension   . Migraine    "I pretty much keep a migraine; really bad probably once/month" (09/16/2015)  . Pneumonia "several times"    No family history on  file.  Past Surgical History:  Procedure Laterality Date  . CHOLECYSTECTOMY OPEN  12/1984  . ESOPHAGOGASTRODUODENOSCOPY    . EYE SURGERY    . GLAUCOMA SURGERY Bilateral ~ 2002  . JOINT REPLACEMENT    . TOTAL HIP ARTHROPLASTY Left 09/16/2015  . TOTAL HIP ARTHROPLASTY Left 09/16/2015   Procedure: LEFT TOTAL HIP ARTHROPLASTY ANTERIOR APPROACH;  Surgeon: Tarry KosNaiping M Ireland Virrueta, MD;  Location: [facility] OR;  Service: Orthopedics;  Laterality: Left;   Social History   Occupational History  . Not on file.   Social History Main Topics  . Smoking status: Never Smoker  . Smokeless tobacco: Never Used  . Alcohol use No  . Drug use: No  . Sexual activity: No

## 2015-12-14 ENCOUNTER — Telehealth (INDEPENDENT_AMBULATORY_CARE_PROVIDER_SITE_OTHER): Payer: Self-pay | Admitting: Orthopaedic Surgery

## 2015-12-14 NOTE — Telephone Encounter (Signed)
Patient would like a call back regarding her disability paperwork.

## 2015-12-15 NOTE — Telephone Encounter (Signed)
Called Pt no answer LMOM.  She can call me back to discuss paperwork.

## 2015-12-16 NOTE — Telephone Encounter (Signed)
Yes please.  Just write the work note exactly how she wants it.  Thanks.

## 2015-12-17 NOTE — Telephone Encounter (Signed)
Faxed work note to disability to [phone]. Pt aware

## 2016-01-04 ENCOUNTER — Other Ambulatory Visit (INDEPENDENT_AMBULATORY_CARE_PROVIDER_SITE_OTHER): Payer: Self-pay | Admitting: Orthopaedic Surgery

## 2016-02-07 ENCOUNTER — Telehealth (INDEPENDENT_AMBULATORY_CARE_PROVIDER_SITE_OTHER): Payer: Self-pay | Admitting: Orthopaedic Surgery

## 2016-02-08 NOTE — Telephone Encounter (Signed)
Called pt to advise last form was faxed back in October we have not received anything since then. I gave her new fax number, since we had trouble with our other fax.

## 2016-03-14 ENCOUNTER — Ambulatory Visit (INDEPENDENT_AMBULATORY_CARE_PROVIDER_SITE_OTHER): Payer: BLUE CROSS/BLUE SHIELD | Admitting: Orthopaedic Surgery

## 2017-05-07 ENCOUNTER — Ambulatory Visit (INDEPENDENT_AMBULATORY_CARE_PROVIDER_SITE_OTHER): Payer: Self-pay

## 2017-05-07 ENCOUNTER — Ambulatory Visit (INDEPENDENT_AMBULATORY_CARE_PROVIDER_SITE_OTHER): Payer: 59 | Admitting: Physician Assistant

## 2017-05-07 ENCOUNTER — Encounter (INDEPENDENT_AMBULATORY_CARE_PROVIDER_SITE_OTHER): Payer: Self-pay | Admitting: Physician Assistant

## 2017-05-07 DIAGNOSIS — M7062 Trochanteric bursitis, left hip: Secondary | ICD-10-CM | POA: Diagnosis not present

## 2017-05-07 NOTE — Progress Notes (Signed)
Patient: Taylor Short           Date of Birth: 1955-12-22           MRN: [id number] Visit Date: 05/07/2017 PCP: Deneen Hartsodd, Elizabeth, FNP     Chief Complaint:  Chief Complaint  Patient presents with  . Left Hip - Pain   Visit Diagnoses:  1. Trochanteric bursitis of left hip     PLAN: Impression is left hip trochanteric bursitis.  Today, we will inject this with cortisone.  We will also provide her with an iliotibial band stretching program.  She will follow-up with us on an as-needed basis.  She will call with concerns or questions in the meantime.  HPI Patient comes in today with recurrent left hip pain.  She is 20 months status post direct anterior total hip replacement, on the left date of surgery 09/16/2015.  She was initially doing well until a few months ago.  No change in activity or injury but she did notice a gnawing pain to the lateral aspect of her left hip.  This was exacerbated this past weekend without additional injury or change in activity.  The majority of her pain is to the lateral aspect of the hip.  Occasional anterior thigh and buttocks pain.  Pain is worse going up and down stairs, putting her shoes on as well as sleeping on the affected side.  Nothing seems to make this better.  She also has occasional numbness and tingling down the entire left leg and her foot.  Of note she does have a history of trochanteric bursitis to the left hip back in 2017.  She had this injected with cortisone which significantly helped.  She also notes a history of a bulging disc in her back.  No previous ESI or surgical intervention there.  Physical exam: Well-developed well-nourished female no acute distress.  Alert and oriented x3. Examination of her left leg reveals marked tenderness over the greater trochanter.  Negative logroll and negative straight leg raise.  She is rest intact distally.  Follow-Up Instructions: No Follow-up on file.   Orders:  Orders Placed This Encounter    Procedures  . XR Pelvis 1-2 Views   No orders of the defined types were placed in this encounter.   Imaging: Xr Pelvis 1-2 Views  Result Date: 05/07/2017 X-rays of her left hip reveal a well-seated prosthesis without evidence of subsidence or osteolysis   PMFS History: Patient Active Problem List   Diagnosis Date Noted  . Trochanteric bursitis of left hip 05/07/2017  . History of total left hip replacement 09/16/2015  . Hip joint replacement status 09/16/2015   Past Medical History:  Diagnosis Date  . Anemia   . Arthritis    "left hip" (09/16/2015)  . Asthma   . Chronic bronchitis (HCC)    "although I haven't had it in the last couple years" (09/16/2015)  . Family history of adverse reaction to anesthesia    "dad's BP drops; brother has hard time waking up" (09/16/2015)  . History of gout   . Hypertension   . Migraine    "I pretty much keep a migraine; really bad probably once/month" (09/16/2015)  . Pneumonia "several times"    History reviewed. No pertinent family history.  Past Surgical History:  Procedure Laterality Date  . CHOLECYSTECTOMY OPEN  12/1984  . ESOPHAGOGASTRODUODENOSCOPY    . EYE SURGERY    . GLAUCOMA SURGERY Bilateral ~ 2002  . JOINT REPLACEMENT    .  TOTAL HIP ARTHROPLASTY Left 09/16/2015  . TOTAL HIP ARTHROPLASTY Left 09/16/2015   Procedure: LEFT TOTAL HIP ARTHROPLASTY ANTERIOR APPROACH;  Surgeon: Tarry Kos, MD;  Location: [facility] OR;  Service: Orthopedics;  Laterality: Left;   Social History   Occupational History  . Not on file  Tobacco Use  . Smoking status: Never Smoker  . Smokeless tobacco: Never Used  Substance and Sexual Activity  . Alcohol use: No  . Drug use: No  . Sexual activity: No

## 2017-05-07 NOTE — Progress Notes (Signed)
   Procedure Note  Patient: Taylor BillingsDee Anna Lewers             Date of Birth: [birth year]           MRN: [id number]             Visit Date: 05/07/2017  Procedures: Visit Diagnoses: Trochanteric bursitis of left hip - Plan: XR Pelvis 1-2 Views  Large Joint Inj: L greater trochanter on 05/07/2017 4:05 PM Indications: pain Details: 22 G needle, lateral approach Medications: 3 mL lidocaine 1 %; 2 mL bupivacaine 0.25 %; 40 mg methylPREDNISolone acetate 40 MG/ML

## 2017-05-08 MED ORDER — METHYLPREDNISOLONE ACETATE 40 MG/ML IJ SUSP
40.0000 mg | INTRAMUSCULAR | Status: AC | PRN
  Administered 2017-05-07: 40 mg via INTRA_ARTICULAR

## 2017-05-08 MED ORDER — LIDOCAINE HCL 1 % IJ SOLN
3.0000 mL | INTRAMUSCULAR | Status: AC | PRN
  Administered 2017-05-07: 3 mL

## 2017-05-08 MED ORDER — BUPIVACAINE HCL 0.25 % IJ SOLN
2.0000 mL | INTRAMUSCULAR | Status: AC | PRN
  Administered 2017-05-07: 2 mL via INTRA_ARTICULAR

## 2018-10-29 ENCOUNTER — Encounter (HOSPITAL_COMMUNITY): Payer: Self-pay | Admitting: Pharmacy Technician

## 2018-10-29 ENCOUNTER — Other Ambulatory Visit: Payer: Self-pay

## 2018-10-29 ENCOUNTER — Emergency Department (HOSPITAL_COMMUNITY)
Admission: EM | Admit: 2018-10-29 | Discharge: 2018-10-30 | Disposition: A | Discharge status: Home / Self Care | Payer: 59 | Attending: Emergency Medicine | Admitting: Emergency Medicine

## 2018-10-29 DIAGNOSIS — J45909 Unspecified asthma, uncomplicated: Secondary | ICD-10-CM | POA: Insufficient documentation

## 2018-10-29 DIAGNOSIS — Z96649 Presence of unspecified artificial hip joint: Secondary | ICD-10-CM | POA: Insufficient documentation

## 2018-10-29 DIAGNOSIS — R197 Diarrhea, unspecified: Secondary | ICD-10-CM | POA: Insufficient documentation

## 2018-10-29 DIAGNOSIS — I10 Essential (primary) hypertension: Secondary | ICD-10-CM | POA: Diagnosis not present

## 2018-10-29 DIAGNOSIS — Z79899 Other long term (current) drug therapy: Secondary | ICD-10-CM | POA: Diagnosis not present

## 2018-10-29 DIAGNOSIS — Z791 Long term (current) use of non-steroidal anti-inflammatories (NSAID): Secondary | ICD-10-CM | POA: Insufficient documentation

## 2018-10-29 LAB — COMPREHENSIVE METABOLIC PANEL
ALT: 34 U/L (ref 0–44)
AST: 21 U/L (ref 15–41)
Albumin: 3.3 g/dL — ABNORMAL LOW (ref 3.5–5.0)
Alkaline Phosphatase: 128 U/L — ABNORMAL HIGH (ref 38–126)
Anion gap: 12 (ref 5–15)
BUN: 15 mg/dL (ref 8–23)
CO2: 24 mmol/L (ref 22–32)
Calcium: 9.5 mg/dL (ref 8.9–10.3)
Chloride: 102 mmol/L (ref 98–111)
Creatinine, Ser: 0.95 mg/dL (ref 0.44–1.00)
GFR calc Af Amer: >60 mL/min (ref >60)
GFR calc non Af Amer: >60 mL/min (ref >60)
Glucose, Bld: 124 mg/dL — ABNORMAL HIGH (ref 70–99)
Potassium: 3.5 mmol/L (ref 3.5–5.1)
Sodium: 138 mmol/L (ref 135–145)
Total Bilirubin: 0.6 mg/dL (ref 0.3–1.2)
Total Protein: 7.6 g/dL (ref 6.5–8.1)

## 2018-10-29 LAB — CBC
HCT: 39.8 % (ref 36.0–46.0)
Hemoglobin: 12.4 g/dL (ref 12.0–15.0)
MCH: 27.8 pg (ref 26.0–34.0)
MCHC: 31.2 g/dL (ref 30.0–36.0)
MCV: 89.2 fL (ref 80.0–100.0)
Platelets: 385 10*3/uL (ref 150–400)
RBC: 4.46 MIL/uL (ref 3.87–5.11)
RDW: 16.1 % — ABNORMAL HIGH (ref 11.5–15.5)
WBC: 6.6 10*3/uL (ref 4.0–10.5)
nRBC: 0 % (ref 0.0–0.2)

## 2018-10-29 LAB — LIPASE, BLOOD: Lipase: 34 U/L (ref 11–51)

## 2018-10-29 MED ORDER — SODIUM CHLORIDE 0.9% FLUSH: 3.0000 mL | Freq: Once | INTRAVENOUS | Status: DC

## 2018-10-29 MED ORDER — ONDANSETRON HCL 4 MG/2ML IJ SOLN
4.0000 mg | Freq: Once | INTRAMUSCULAR | Status: AC
  Administered 2018-10-30: 4 mg via INTRAVENOUS
  Filled 2018-10-29: qty 2

## 2018-10-29 MED ORDER — SODIUM CHLORIDE 0.9 % IV BOLUS
1000.0000 mL | Freq: Once | INTRAVENOUS | Status: AC
  Administered 2018-10-30: 1000 mL via INTRAVENOUS

## 2018-10-29 NOTE — ED Triage Notes (Signed)
Pt reports having diarrhea X3weeks. Went to emergency room last week and was told she had "hepatitis and messy liver" as well as low potassium and dehydration. Was supposed to see GI but was told he would be unable to see her due to her having hepatitis.

## 2018-10-30 ENCOUNTER — Encounter: Payer: Self-pay | Admitting: Gastroenterology

## 2018-10-30 LAB — GASTROINTESTINAL PANEL BY PCR, STOOL (REPLACES STOOL CULTURE)

## 2018-10-30 MED ORDER — CIPROFLOXACIN HCL 500 MG PO TABS: 500.0000 mg | ORAL_TABLET | Freq: Two times a day (BID) | ORAL | 0 refills | Status: DC

## 2018-10-30 MED ORDER — ONDANSETRON 4 MG PO TBDP: 4.0000 mg | ORAL_TABLET | Freq: Three times a day (TID) | ORAL | 0 refills | Status: DC | PRN

## 2018-10-30 NOTE — ED Provider Notes (Signed)
[facility] EMERGENCY DEPARTMENT Provider Note   CSN: [id number] Arrival date & time: 10/29/18  1701     History   Chief Complaint Chief Complaint  Patient presents with  . Diarrhea  . Abnormal Lab    HPI Taylor Short is a 63 y.o. female.     Patient presents to the emergency department with a chief complaint of 2-1/2 weeks worth of diarrhea.  She describes the diarrhea as being watery.  She denies seeing any blood in the stool.  She states that she has also had some persistent nausea and vomiting.  Was seen at an outside facility, and was diagnosed with transaminitis, which was thought to be viral hepatitis.  It is noted that her AST and ALT are normal tonight.  She denies any fevers or chills.  She states that she was scheduled to see gastroenterologist tomorrow, but that appointment got canceled.  She has not tried taking anything other than Lomotil for her diarrhea.  She has not been on antibiotics.  The history is provided by the patient. No language interpreter was used.    Past Medical History:  Diagnosis Date  . Anemia   . Arthritis    "left hip" (09/16/2015)  . Asthma   . Chronic bronchitis ([place])    "although I haven't had it in the last couple years" (09/16/2015)  . Family history of adverse reaction to anesthesia    "dad's BP drops; brother has hard time waking up" (09/16/2015)  . History of gout   . Hypertension   . Migraine    "I pretty much keep a migraine; really bad probably once/month" (09/16/2015)  . Pneumonia "several times"    Patient Active Problem List   Diagnosis Date Noted  . Trochanteric bursitis of left hip 05/07/2017  . History of total left hip replacement 09/16/2015  . Hip joint replacement status 09/16/2015    Past Surgical History:  Procedure Laterality Date  . CHOLECYSTECTOMY OPEN  12/1984  . ESOPHAGOGASTRODUODENOSCOPY    . EYE SURGERY    . GLAUCOMA SURGERY Bilateral ~ 2002  . JOINT REPLACEMENT    . TOTAL HIP  ARTHROPLASTY Left 09/16/2015  . TOTAL HIP ARTHROPLASTY Left 09/16/2015   Procedure: LEFT TOTAL HIP ARTHROPLASTY ANTERIOR APPROACH;  Surgeon: Leandrew Koyanagi, MD;  Location: [facility];  Service: Orthopedics;  Laterality: Left;     OB History   No obstetric history on file.      Home Medications    Prior to Admission medications   Medication Sig Start Date End Date Taking? Authorizing Provider  acetaminophen (TYLENOL) 325 MG tablet Take 650 mg by mouth every 6 (six) hours as needed for mild pain.    [provider]  albuterol (PROVENTIL HFA;VENTOLIN HFA) 108 (90 Base) MCG/ACT inhaler Inhale 2 puffs into the lungs as needed for wheezing or shortness of breath.    [provider]  allopurinol (ZYLOPRIM) 300 MG tablet  04/17/17   [provider]  celecoxib (CELEBREX) 200 MG capsule Take 1 capsule (200 mg total) by mouth every 12 (twelve) hours. Patient not taking: Reported on 05/07/2017 09/18/15   Mcarthur Rossetti, MD  enoxaparin (LOVENOX) 40 MG/0.4ML injection Inject 0.4 mLs (40 mg total) into the skin daily. Patient not taking: Reported on 05/07/2017 09/16/15   Leandrew Koyanagi, MD  gabapentin (NEURONTIN) 300 MG capsule Take 600 mg by mouth daily.    [provider]  hydrochlorothiazide (HYDRODIURIL) 25 MG tablet  04/23/17  [provider]  HYDROmorphone (DILAUDID) 2 MG tablet Take 1 tablet (2 mg total) by mouth every 4 (four) hours as needed for severe pain. Patient not taking: Reported on 05/07/2017 09/16/15   Tarry Kos, MD  indomethacin (INDOCIN) 50 MG capsule  04/23/17   [provider]  meloxicam (MOBIC) 7.5 MG tablet Take 7.5 mg by mouth daily. 08/19/15   [provider]  methocarbamol (ROBAXIN) 750 MG tablet Take 1 tablet (750 mg total) by mouth 2 (two) times daily as needed for muscle spasms. Patient not taking: Reported on 05/07/2017 09/16/15   Tarry Kos, MD  ondansetron (ZOFRAN) 4 MG tablet Take 1-2 tablets (4-8 mg total) by  mouth every 8 (eight) hours as needed for nausea or vomiting. Patient not taking: Reported on 05/07/2017 09/16/15   Tarry Kos, MD  oxyCODONE (OXYCONTIN) 10 mg 12 hr tablet Take 1 tablet (10 mg total) by mouth every 12 (twelve) hours. Patient not taking: Reported on 05/07/2017 09/16/15   Tarry Kos, MD  senna-docusate (SENOKOT S) 8.6-50 MG tablet Take 1 tablet by mouth at bedtime as needed. Patient not taking: Reported on 05/07/2017 09/16/15   Tarry Kos, MD    Family History No family history on file.  Social History Social History   Tobacco Use  . Smoking status: Never Smoker  . Smokeless tobacco: Never Used  Substance Use Topics  . Alcohol use: No  . Drug use: No     Allergies   Aspirin, Other, and Codeine   Review of Systems Review of Systems  All other systems reviewed and are negative.    Physical Exam Updated Vital Signs BP (!) 144/94   Pulse 82   Temp 98.6 F (37 C) (Oral)   Resp 18   Ht 5\' 6"  (1.676 m)   Wt 90.7 kg   LMP 09/07/2010   SpO2 100%   BMI 32.28 kg/m   Physical Exam Vitals signs and nursing note reviewed.  Constitutional:      General: She is not in acute distress.    Appearance: She is well-developed.  HENT:     Head: Normocephalic and atraumatic.  Eyes:     Conjunctiva/sclera: Conjunctivae normal.  Neck:     Musculoskeletal: Neck supple.  Cardiovascular:     Rate and Rhythm: Normal rate and regular rhythm.     Heart sounds: No murmur.  Pulmonary:     Effort: Pulmonary effort is normal. No respiratory distress.     Breath sounds: Normal breath sounds.  Abdominal:     Palpations: Abdomen is soft.     Tenderness: There is no abdominal tenderness.     Comments: Some vague discomfort, but no focal tenderness  Musculoskeletal: Normal range of motion.  Skin:    General: Skin is warm and dry.  Neurological:     Mental Status: She is alert and oriented to person, place, and time.  Psychiatric:        Mood and Affect: Mood  normal.      ED Treatments / Results  Labs (all labs ordered are listed, but only abnormal results are displayed) Labs Reviewed  COMPREHENSIVE METABOLIC PANEL - Abnormal; Notable for the following components:      Result Value   Glucose, Bld 124 (*)    Albumin 3.3 (*)    Alkaline Phosphatase 128 (*)    All other components within normal limits  CBC - Abnormal; Notable for the following components:   RDW 16.1 (*)  All other components within normal limits  LIPASE, BLOOD  URINALYSIS, ROUTINE W REFLEX MICROSCOPIC    EKG EKG Interpretation  Date/Time:  Tuesday October 29 2018 17:35:53 EDT Ventricular Rate:  89 PR Interval:  152 QRS Duration: 72 QT Interval:  354 QTC Calculation: 430 R Axis:   9 Text Interpretation:  Normal sinus rhythm Cannot rule out Anterior infarct , age undetermined Abnormal ECG When compared with ECG of 09/07/2015, No significant change was found Confirmed by Dione BoozeGlick, David ([id number]) on 10/29/2018 11:43:46 PM   Radiology No results found.  Procedures Procedures (including critical care time)  Medications Ordered in ED Medications  sodium chloride flush (NS) 0.9 % injection 3 mL (3 mLs Intravenous Not Given 10/29/18 2357)  sodium chloride 0.9 % bolus 1,000 mL (has no administration in time range)  ondansetron (ZOFRAN) injection 4 mg (has no administration in time range)     Initial Impression / Assessment and Plan / ED Course  I have reviewed the triage vital signs and the nursing notes.  Pertinent labs & imaging results that were available during my care of the patient were reviewed by me and considered in my medical decision making (see chart for details).        Patient with watery diarrhea x2 weeks.  Laboratory work-up is reassuring today.  Her LFTs have improved from her recent visit at an outside facility according to review of medical record.  Her abdomen is soft and nontender.  Doubt surgical or acute abdomen.  Doubt SBO.  Her vital signs  are stable.  She is afebrile.  She has not been on recent antibiotics, doubt C. difficile.  We will give some fluid and Zofran based on her complaints of persistent nausea and vomiting.  Will need to fluid challenge patient.  Anticipate discharge to home with continued outpatient follow-up.  Consider course of Cipro given the length of time she has had the diarrhea.  Apparently, she has stool cultures pending at her primary care doctor's office.  We were able to collect a repeat sample tonight.  Patient is tolerating oral fluids in the emergency department.  She has received a liter of IV fluid.  Her laboratory work-up looks improved from her prior emergency department visit.  At this time, feel the patient is stable for discharge and continued outpatient follow-up.  If symptoms change or worsen, she has been advised to return to the emergency department.    Final Clinical Impressions(s) / ED Diagnoses   Final diagnoses:  Diarrhea of presumed infectious origin    ED Discharge Orders    None       Roxy HorsemanBrowning, Seini Lannom, PA-C 10/30/18 0206    Dione BoozeGlick, David, MD 10/30/18 [phone]

## 2018-10-30 NOTE — ED Notes (Signed)
P.o. Challenge started.

## 2018-10-30 NOTE — Discharge Instructions (Addendum)
Your laboratory work-up is improved from your recent emergency department encounter.  Your AST and ALT (liver enzymes), have trended down to normal tonight.  We collected a stool sample tonight, of the stool pathogen panel will take approximately 2 to 3 days to result.  If something is positive, we will call you.  You do not have an elevated white blood cell count.  Your electrolytes did not show evidence of severe dehydration.  You were given some fluid in the emergency department.  Please take the antibiotics and Zofran as prescribed.  The Zofran should help you keep food down.  If something changes or worsens, please return to the emergency department (fever, severe abdominal pain, bloody stools, bloody vomit).  Otherwise, please follow-up with your primary care doctor or with the gastroenterologist listed.

## 2018-11-06 ENCOUNTER — Ambulatory Visit: Payer: 59 | Admitting: Gastroenterology

## 2018-11-06 ENCOUNTER — Encounter: Payer: Self-pay | Admitting: Gastroenterology

## 2018-11-06 VITALS — BP 152/100 | HR 88 | Temp 98.2°F | Ht 66.0 in | Wt 197.8 lb

## 2018-11-06 DIAGNOSIS — R197 Diarrhea, unspecified: Secondary | ICD-10-CM | POA: Diagnosis not present

## 2018-11-06 DIAGNOSIS — K219 Gastro-esophageal reflux disease without esophagitis: Secondary | ICD-10-CM

## 2018-11-06 DIAGNOSIS — R112 Nausea with vomiting, unspecified: Secondary | ICD-10-CM

## 2018-11-06 DIAGNOSIS — R1013 Epigastric pain: Secondary | ICD-10-CM | POA: Diagnosis not present

## 2018-11-06 MED ORDER — DIPHENOXYLATE-ATROPINE 2.5-0.025 MG PO TABS: 1.0000 | ORAL_TABLET | Freq: Four times a day (QID) | ORAL | 0 refills | Status: AC | PRN

## 2018-11-06 MED ORDER — ONDANSETRON 4 MG PO TBDP: 4.0000 mg | ORAL_TABLET | Freq: Three times a day (TID) | ORAL | 0 refills | Status: AC | PRN

## 2018-11-06 MED ORDER — NA SULFATE-K SULFATE-MG SULF 17.5-3.13-1.6 GM/177ML PO SOLN: 1.0000 | Freq: Once | ORAL | 0 refills | Status: AC

## 2018-11-06 NOTE — Patient Instructions (Signed)
We have sent the following medications to your pharmacy for you to pick up at your convenience: Lomotil and Zofran.  Start over the counter probiotic Florastor twice daily.   You have been scheduled for an endoscopy and colonoscopy. Please follow the written instructions given to you at your visit today. Please pick up your prep supplies at the pharmacy within the next 1-3 days. If you use inhalers (even only as needed), please bring them with you on the day of your procedure.

## 2018-11-06 NOTE — Progress Notes (Signed)
11/06/2018 Taylor Short [id number] [birth year]   HISTORY OF PRESENT ILLNESS: This is a pleasant 63 year old female who is new to our office.  She was referred here by her PCP, Taylor Posey, FNP, for evaluation regarding diarrhea.  She tells me that all of this started back at the beginning of July when she developed epigastric abdominal pain.  She says that it woke her from sleep and she had severe acid reflux.  Had never really had any issues with acid reflux in the past.  She tried over-the-counter Prilosec without any improvement.  Then she went on over-the-counter Nexium and that seemed to help.  Then about a month or so later she had sudden onset of severe diarrhea.  Also had nausea and vomiting.  Symptoms were very severe and she ended up going to the emergency department in [facility] where she was found to have potassium low at 2.8.  They performed stool studies with culture negative and stool for Shiga toxins were negative.  Fecal leukocytes was positive.  Interestingly, she had elevated LFTs at that time as well with an alk phos of 180, ALT 119, AST 162, total bilirubin 1.6.  Viral hepatitis panel was negative.  Four days later she returned to the emergency department here through [facility] system and LFTs were normal.  CBC and lipase also normal.  Potassium was 3.5.  GI pathogen panel here was negative.  They treated her empirically with a 7-day course of Cipro 500 mg twice daily, which she just completed.  CT scan of the abdomen and pelvis with contrast at [facility] was negative for acute intra-abdominal or pelvic pathology.  She has never had an EGD or colonoscopy in the past.  At this time she has improved, but still has a moderate amount of diarrhea with nocturnal stools.  Says that occasionally she has a start sharp pain on her right side.  Currently she is not on any type of PPI therapy and those symptoms have seemed to improve.  Still does have some nausea.  No blood in  stool.   Past Medical History:  Diagnosis Date  . Anemia   . Arthritis    "left hip" (09/16/2015)  . Asthma   . Chronic bronchitis ([place])    "although I haven't had it in the last couple years" (09/16/2015)  . Family history of adverse reaction to anesthesia    "dad's BP drops; brother has hard time waking up" (09/16/2015)  . History of gout   . Hypertension   . Migraine    "I pretty much keep a migraine; really bad probably once/month" (09/16/2015)  . Pneumonia "several times"   Past Surgical History:  Procedure Laterality Date  . CHOLECYSTECTOMY OPEN  12/1984  . ESOPHAGOGASTRODUODENOSCOPY    . EYE SURGERY    . GLAUCOMA SURGERY Bilateral ~ 2002  . JOINT REPLACEMENT    . TOTAL HIP ARTHROPLASTY Left 09/16/2015  . TOTAL HIP ARTHROPLASTY Left 09/16/2015   Procedure: LEFT TOTAL HIP ARTHROPLASTY ANTERIOR APPROACH;  Surgeon: Leandrew Koyanagi, MD;  Location: [facility];  Service: Orthopedics;  Laterality: Left;    reports that she has never smoked. She has never used smokeless tobacco. She reports that she does not drink alcohol or use drugs. family history includes Heart disease in her mother; Kidney disease in her mother. Allergies  Allergen Reactions  . Aspirin Other (See Comments)    Nose bleeds  . Other Other (See Comments)    Ingredients in  EPIDURALS  . Codeine Other (See Comments)    Hallucinations      Outpatient Encounter Medications as of 11/06/2018  Medication Sig  . albuterol (PROVENTIL HFA;VENTOLIN HFA) 108 (90 Base) MCG/ACT inhaler Inhale 2 puffs into the lungs as needed for wheezing or shortness of breath.  . allopurinol (ZYLOPRIM) 300 MG tablet Take 300 mg by mouth daily.   . ciprofloxacin (CIPRO) 500 MG tablet Take 1 tablet (500 mg total) by mouth 2 (two) times daily.  . diphenoxylate-atropine (LOMOTIL) 2.5-0.025 MG tablet Take 1 tablet by mouth 4 (four) times daily as needed for diarrhea or loose stools.   . gabapentin (NEURONTIN) 300 MG capsule Take 600 mg by mouth at  bedtime.   . hydrochlorothiazide (HYDRODIURIL) 25 MG tablet Take 25 mg by mouth daily as needed (for fluid).   . indomethacin (INDOCIN) 50 MG capsule Take 150 mg by mouth daily.   . ondansetron (ZOFRAN ODT) 4 MG disintegrating tablet Take 1 tablet (4 mg total) by mouth every 8 (eight) hours as needed for nausea or vomiting.  . potassium chloride (K-DUR) 10 MEQ tablet Take 10 mEq by mouth 2 (two) times daily.  Marland Kitchen acetaminophen (TYLENOL) 325 MG tablet Take 650 mg by mouth every 6 (six) hours as needed for mild pain.  . celecoxib (CELEBREX) 200 MG capsule Take 1 capsule (200 mg total) by mouth every 12 (twelve) hours. (Patient not taking: Reported on 05/07/2017)  . enoxaparin (LOVENOX) 40 MG/0.4ML injection Inject 0.4 mLs (40 mg total) into the skin daily. (Patient not taking: Reported on 05/07/2017)  . HYDROmorphone (DILAUDID) 2 MG tablet Take 1 tablet (2 mg total) by mouth every 4 (four) hours as needed for severe pain. (Patient not taking: Reported on 05/07/2017)  . methocarbamol (ROBAXIN) 750 MG tablet Take 1 tablet (750 mg total) by mouth 2 (two) times daily as needed for muscle spasms. (Patient not taking: Reported on 05/07/2017)  . ondansetron (ZOFRAN) 4 MG tablet Take 1-2 tablets (4-8 mg total) by mouth every 8 (eight) hours as needed for nausea or vomiting. (Patient not taking: Reported on 05/07/2017)  . oxyCODONE (OXYCONTIN) 10 mg 12 hr tablet Take 1 tablet (10 mg total) by mouth every 12 (twelve) hours. (Patient not taking: Reported on 05/07/2017)  . senna-docusate (SENOKOT S) 8.6-50 MG tablet Take 1 tablet by mouth at bedtime as needed. (Patient not taking: Reported on 05/07/2017)   No facility-administered encounter medications on file as of 11/06/2018.      REVIEW OF SYSTEMS  : All other systems reviewed and negative except where noted in the History of Present Illness.   PHYSICAL EXAM: BP (!) 152/100   Pulse 88   Temp 98.2 F (36.8 C)   Ht 5' 6"  (1.676 m)   Wt 197 lb 12.8 oz (89.7 kg)    LMP 09/07/2010   BMI 31.93 kg/m  General: Well developed white female in no acute distress Head: Normocephalic and atraumatic Eyes:  Sclerae anicteric, conjunctiva pink. Ears: Normal auditory acuity Lungs: Clear throughout to auscultation; no increased WOB. Heart: Regular rate and rhythm; no M/R/G. Abdomen: Soft, non-distended.  BS present.  Non-tender. Rectal:  Will be done at the time of colonoscopy. Musculoskeletal: Symmetrical with no gross deformities  Skin: No lesions on visible extremities Extremities: No edema  Neurological: Alert oriented x 4, grossly non-focal Psychological:  Alert and cooperative. Normal mood and affect  ASSESSMENT AND PLAN: *Diarrhea:  Severe, sudden onset.  Had nausea and vomiting as well, vomiting resolved but still with  nausea at times.  Had associated dehydration with K+ down to 2.8.  Somewhat improved but still a big issue and having nocturnal stools. *Epigastric pain and GERD:  This was very new for her as well, but began about a month prior to the diarrhea issue.  Improved now and not on PPI, but was very "out of the blue" and quite severe when it started. *Elevated LFTs: They were all elevated at ER visit in [facility], but then 4 days later at ER visit here through the [facility] system they had normalized.  Viral hepatitis studies are negative.  ? Reactive vs viral etiology.  -With ongoing diarrhea and negative infectious source, I think that she needs colonoscopy to rule out microscopic colitis, IBD, etc.   -Will plan for EGD as well due to  Both of these procedures have been scheduled with Dr. Tarri Glenn. -Will renew lomotil prescription and zofran prescription. -I have asked her to start Florastor probiotic BID.  **The risks, benefits, and alternatives to EGD and colonoscopy were discussed with the patient and she consents to proceed.  **Note:  When I looked at her labs in [facility] initally it says "Cdiff-Shiga toxin" was negative but then  when I looked at it further in the results it says Shiga toxin, NOT Cdiff.  Because of this it looks like she in fact has NOT been checked for Cdiff from what I can tell.  I am having the patient come for Cdiff stool test.   CC:  Taylor Posey, FNP

## 2018-11-07 ENCOUNTER — Encounter: Payer: Self-pay | Admitting: Gastroenterology

## 2018-11-07 ENCOUNTER — Telehealth: Payer: Self-pay

## 2018-11-07 DIAGNOSIS — R112 Nausea with vomiting, unspecified: Secondary | ICD-10-CM | POA: Insufficient documentation

## 2018-11-07 DIAGNOSIS — R1013 Epigastric pain: Secondary | ICD-10-CM | POA: Insufficient documentation

## 2018-11-07 DIAGNOSIS — R197 Diarrhea, unspecified: Secondary | ICD-10-CM

## 2018-11-07 DIAGNOSIS — K219 Gastro-esophageal reflux disease without esophagitis: Secondary | ICD-10-CM | POA: Insufficient documentation

## 2018-11-07 NOTE — Telephone Encounter (Signed)
-----   Message from Loralie Champagne, PA-C sent at 11/07/2018  2:54 PM EDT ----- I saw this patient in clinic yesterday.  When I looked at her labs in [facility] it says that Roe was negative but then when I looked at it further in the results it says Shiga toxin, NOT Cdiff.  Because of this it looks like she in fact has NOT been checked for Cdiff.  Please see if she could have Cdiff stool study performed in our facility ASAP.  She [place] quite far away so please apologize to her, but the way they have those results listed is confusing.  I am glad that I caught it though because I want to make sure she is Cdiff negative before her colonoscopy.  Thank you, Jess

## 2018-11-08 ENCOUNTER — Encounter: Payer: 59 | Admitting: Gastroenterology

## 2018-11-08 NOTE — Progress Notes (Signed)
Reviewed. I agree with documentation including the assessment and plan. Will proceed with EGD and colonoscopy to evaluate her diarrhea.   Zacarias Krauter L. Tarri Glenn, MD, MPH

## 2018-11-18 ENCOUNTER — Telehealth: Payer: Self-pay | Admitting: Gastroenterology

## 2018-11-18 NOTE — Telephone Encounter (Signed)
Left message for the patient's son to call back

## 2018-11-19 ENCOUNTER — Encounter: Payer: Self-pay | Admitting: Gastroenterology

## 2018-11-25 ENCOUNTER — Telehealth: Payer: Self-pay | Admitting: Gastroenterology

## 2018-11-25 NOTE — Telephone Encounter (Signed)
Hi Dr. Tarri Glenn, this pt just cancelled her procedure that was scheduled on Wednesday with you because she is hospitalized with Covid-19. She will call back to reschedule. Thank you.

## 2018-11-25 NOTE — Telephone Encounter (Signed)
Thank you for the update. I hope she has a speedy recovery.

## 2018-11-27 ENCOUNTER — Encounter: Payer: 59 | Admitting: Gastroenterology

## 2019-01-09 DIAGNOSIS — D649 Anemia, unspecified: Secondary | ICD-10-CM | POA: Diagnosis not present

## 2019-01-27 DIAGNOSIS — D649 Anemia, unspecified: Secondary | ICD-10-CM | POA: Diagnosis not present

## 2019-07-28 DIAGNOSIS — D649 Anemia, unspecified: Secondary | ICD-10-CM | POA: Diagnosis not present

## 2020-01-13 ENCOUNTER — Other Ambulatory Visit: Payer: Self-pay | Admitting: Hematology and Oncology

## 2020-01-13 DIAGNOSIS — D649 Anemia, unspecified: Secondary | ICD-10-CM

## 2020-01-26 ENCOUNTER — Other Ambulatory Visit: Payer: Self-pay | Admitting: Oncology

## 2020-01-26 DIAGNOSIS — D649 Anemia, unspecified: Secondary | ICD-10-CM

## 2020-01-26 NOTE — Progress Notes (Signed)
[facility]  [address] [place],  [place]  [address] [phone]  Clinic Day:  01/27/2020  Referring physician: Galvin Proffer, MD   HISTORY OF PRESENT ILLNESS:  The patient is a 64 y.o. female who I recently began seeing for anemia.  However, at her last visit, her hemoglobin was normal at 12.9.  She comes in today to reassess her hemoglobin.  Since her last visit, the patient has been doing fine.  She denies having increased or any overt forms of blood loss which concern her for progressive anemia.     PHYSICAL EXAM:  Blood pressure (!) 190/72, pulse 92, temperature 98.8 F (37.1 C), resp. rate 16, height 5' 6.5" (1.689 m), weight 202 lb 9.6 oz (91.9 kg), last menstrual period 09/07/2010, SpO2 99 %. Wt Readings from Last 3 Encounters:  01/27/20 202 lb 9.6 oz (91.9 kg)  11/06/18 197 lb 12.8 oz (89.7 kg)  10/29/18 200 lb (90.7 kg)   Body mass index is 32.21 kg/m. Performance status (ECOG): 0 - Asymptomatic Physical Exam Constitutional:      Appearance: Normal appearance. She is not ill-appearing.  HENT:     Mouth/Throat:     Mouth: Mucous membranes are moist.     Pharynx: Oropharynx is clear. No oropharyngeal exudate or posterior oropharyngeal erythema.  Cardiovascular:     Rate and Rhythm: Normal rate and regular rhythm.     Heart sounds: No murmur heard.  No friction rub. No gallop.   Pulmonary:     Effort: Pulmonary effort is normal. No respiratory distress.     Breath sounds: Normal breath sounds. No wheezing, rhonchi or rales.  Abdominal:     General: Bowel sounds are normal. There is no distension.     Palpations: Abdomen is soft. There is no mass.     Tenderness: There is no abdominal tenderness.  Musculoskeletal:        General: No swelling.     Right lower leg: No edema.     Left lower leg: No edema.  Lymphadenopathy:     Cervical: No cervical adenopathy.     Upper Body:     Right upper body: No supraclavicular or  axillary adenopathy.     Left upper body: No supraclavicular or axillary adenopathy.     Lower Body: No right inguinal adenopathy. No left inguinal adenopathy.  Skin:    General: Skin is warm.     Coloration: Skin is not jaundiced.     Findings: No lesion or rash.  Neurological:     General: No focal deficit present.     Mental Status: She is alert and oriented to person, place, and time. Mental status is at baseline.     Cranial Nerves: Cranial nerves are intact.  Psychiatric:        Mood and Affect: Mood normal.        Behavior: Behavior normal.        Thought Content: Thought content normal.     LABS:   CBC Latest Ref Rng & Units 01/27/2020 10/29/2018 09/17/2015  WBC - 7.8 6.6 5.6  Hemoglobin 12.0 - 16.0 12.4 12.4 10.0(L)  Hematocrit 36 - 46 36 39.8 30.9(L)  Platelets 150 - 399 286 385 184   CMP Latest Ref Rng & Units 01/27/2020 10/29/2018 09/17/2015  Glucose 70 - 99 mg/dL - 323(F) 573(U)  BUN 4 - 21 30(A) 15 8  Creatinine 0.5 - 1.1 1.2(A) 0.95 0.61  Sodium 137 - 147  140 138 139  Potassium 3.4 - 5.3 3.6 3.5 3.8  Chloride 99 - 108 101 102 107  CO2 13 - 22 27(A) 24 26  Calcium 8.7 - 10.7 9.5 9.5 8.5(L)  Total Protein 6.5 - 8.1 g/dL - 7.6 -  Total Bilirubin 0.3 - 1.2 mg/dL - 0.6 -  Alkaline Phos 25 - 125 112 128(H) -  AST 13 - 35 31 21 -  ALT 7 - 35 18 34 -    Ref. Range 01/27/2020 00:00  Iron Unknown 66  TIBC Unknown 302  %SAT Unknown 21.8  Ferritin Unknown 31    ASSESSMENT & PLAN:  Assessment/Plan:  A 64 y.o. female with a history of mild anemia.  Once again, her hemoglobin is at a satisfactory level.  Furthermore, her iron parameters are all normal.  This marks the second visit that all of her hematologic parameters are normal.  As she is clinically doing well and has no hematologic issues, I will turn her care back over to her primary care office.  My recommendation would be for her CBC to be checked, at most, 1-2 times per year.  I would not have a problem seeing her in  the future if new hematologic issues arise.  The patient understands all the plans discussed today and is in agreement with them.    Margert Edsall Kirby Funk, MD

## 2020-01-27 ENCOUNTER — Other Ambulatory Visit: Payer: Self-pay | Admitting: Hematology and Oncology

## 2020-01-27 ENCOUNTER — Other Ambulatory Visit: Payer: Self-pay

## 2020-01-27 ENCOUNTER — Encounter: Payer: Self-pay | Admitting: Oncology

## 2020-01-27 ENCOUNTER — Telehealth: Payer: Self-pay

## 2020-01-27 ENCOUNTER — Telehealth: Payer: Self-pay | Admitting: Oncology

## 2020-01-27 ENCOUNTER — Inpatient Hospital Stay (INDEPENDENT_AMBULATORY_CARE_PROVIDER_SITE_OTHER): Payer: BC Managed Care – PPO | Admitting: Oncology

## 2020-01-27 ENCOUNTER — Inpatient Hospital Stay: Payer: BC Managed Care – PPO | Attending: Oncology

## 2020-01-27 VITALS — BP 190/72 | HR 92 | Temp 98.8°F | Resp 16 | Ht 66.5 in | Wt 202.6 lb

## 2020-01-27 DIAGNOSIS — D649 Anemia, unspecified: Secondary | ICD-10-CM

## 2020-01-27 LAB — BASIC METABOLIC PANEL
BUN: 30 — AB (ref 4–21)
CO2: 27 — AB (ref 13–22)
Chloride: 101 (ref 99–108)
Creatinine: 1.2 — AB (ref 0.5–1.1)
Glucose: 105
Potassium: 3.6 (ref 3.4–5.3)
Sodium: 140 (ref 137–147)

## 2020-01-27 LAB — CBC AND DIFFERENTIAL
HCT: 36 (ref 36–46)
Hemoglobin: 12.4 (ref 12.0–16.0)
Neutrophils Absolute: 4.52
Platelets: 286 (ref 150–399)
WBC: 7.8

## 2020-01-27 LAB — CBC
MCV: 86 (ref 76–111)
RBC: 4.23 (ref 3.87–5.11)

## 2020-01-27 LAB — HEPATIC FUNCTION PANEL
ALT: 18 (ref 7–35)
AST: 31 (ref 13–35)
Alkaline Phosphatase: 112 (ref 25–125)
Bilirubin, Total: 0.2

## 2020-01-27 LAB — IRON,TIBC AND FERRITIN PANEL
%SAT: 21.8
Ferritin: 31
Iron: 66
TIBC: 302

## 2020-01-27 LAB — COMPREHENSIVE METABOLIC PANEL
Albumin: 4 (ref 3.5–5.0)
Calcium: 9.5 (ref 8.7–10.7)

## 2020-01-27 NOTE — Telephone Encounter (Signed)
Pt called back (saw Dr Melvyn Neth in clinic today) to make Dr Melvyn Neth aware that she has recently completed medication for shingles. She is also on medication @ present for a spider bite. Dr Melvyn Neth notified, no new orders.

## 2020-01-27 NOTE — Telephone Encounter (Signed)
Per 12/7 LOS, No orders entered

## 2020-01-29 ENCOUNTER — Telehealth: Payer: Self-pay

## 2020-01-29 NOTE — Telephone Encounter (Signed)
Pt called to get iron panel results from last visit. I notified Dr Melvyn Neth, who reviewed labs. He asked me to tell her, "her numbers are fine and he plans to turn her care back over to her primary doctor". Pt notified of Dr Melvyn Neth response above, verbatim. She requested the actual numbers for the iron, ferritin, & iron sat % - which I told her. Pt appreciative of information.

## 2020-09-10 ENCOUNTER — Telehealth: Payer: Self-pay | Admitting: Orthopaedic Surgery

## 2020-09-10 NOTE — Telephone Encounter (Signed)
Spoke to patient.  She needs to come in to see [place] Tuesday morning thanks.

## 2020-09-10 NOTE — Telephone Encounter (Signed)
Pt called asking for a call back from Dr. Roda Shutters. Pt states she had left hip replacement with Dr. Roda Shutters in 2017. Pt states hip is hot to the touch, she has been running a high fever, and severe pains in the hip and leg area. She is asking for medical advice. She has set an appt with Dr. Roda Shutters 8/2 @ 9:30am and would like to know what to do in the meantime

## 2020-09-10 NOTE — Telephone Encounter (Signed)
APPT MADE

## 2020-09-14 ENCOUNTER — Ambulatory Visit: Payer: Self-pay

## 2020-09-14 ENCOUNTER — Other Ambulatory Visit: Payer: Self-pay

## 2020-09-14 ENCOUNTER — Encounter: Payer: Self-pay | Admitting: Orthopaedic Surgery

## 2020-09-14 ENCOUNTER — Ambulatory Visit (INDEPENDENT_AMBULATORY_CARE_PROVIDER_SITE_OTHER): Payer: BC Managed Care – PPO | Admitting: Orthopaedic Surgery

## 2020-09-14 DIAGNOSIS — M25552 Pain in left hip: Secondary | ICD-10-CM | POA: Diagnosis not present

## 2020-09-14 NOTE — Progress Notes (Signed)
Office Visit Note   Patient: Taylor Short           Date of Birth: 11-21-1955           MRN: [id number] Visit Date: 09/14/2020              Requested by: Galvin Proffer, MD [address] [place],  [place] [address] PCP: Galvin Proffer, MD   Assessment & Plan: Visit Diagnoses:  1. Pain in left hip     Plan: I was able to look at a picture that she took when it was completely flared up and my impression is that is a cellulitic or dermatologic process.  Her PCP is currently working this up and they feel that it is due to a tick bite or an allergic reaction.  She is also been placed on prednisone for this.  I think the suspicion for complications related to hip replacement is low.  Questions encouraged and answered.  She will get back in touch with her PCP to do further investigation.  Follow-Up Instructions: Return if symptoms worsen or fail to improve.   Orders:  Orders Placed This Encounter  Procedures   XR HIP UNILAT W OR W/O PELVIS 2-3 VIEWS LEFT   No orders of the defined types were placed in this encounter.     Procedures: No procedures performed   Clinical Data: No additional findings.   Subjective: Chief Complaint  Patient presents with   Left Hip - Pain    Patient is a very pleasant 65 year old female who underwent a left total hip replacement 2017.  She states that she has had 4 episodes in the last 6 months in which her left buttock and hip region developed redness and tenderness and pain.  She states that she had chills and fevers along with this.  She has been prescribed oral biotics each time which resolved.  She is status post fecal transplant for C. difficile.  She denies any thigh or groin pain.  She is currently on doxycycline prescribed by PCP for recent episode.   Review of Systems  Constitutional: Negative.   HENT: Negative.    Eyes: Negative.   Respiratory: Negative.    Cardiovascular: Negative.   Endocrine: Negative.   Musculoskeletal:  Negative.   Neurological: Negative.   Hematological: Negative.   Psychiatric/Behavioral: Negative.    All other systems reviewed and are negative.   Objective: Vital Signs: LMP 09/07/2010   Physical Exam Vitals and nursing note reviewed.  Constitutional:      Appearance: She is well-developed.  HENT:     Head: Normocephalic and atraumatic.  Pulmonary:     Effort: Pulmonary effort is normal.  Abdominal:     Palpations: Abdomen is soft.  Musculoskeletal:     Cervical back: Neck supple.  Skin:    General: Skin is warm.     Capillary Refill: Capillary refill takes less than 2 seconds.  Neurological:     Mental Status: She is alert and oriented to person, place, and time.  Psychiatric:        Behavior: Behavior normal.        Thought Content: Thought content normal.        Judgment: Judgment normal.    Ortho Exam Left hip surgical scars fully healed.  There is no evidence of infection.  Range of motion of the hip is normal and without pain.  She does have a large area in the buttock area that is mildly  swollen with faint erythema likely due to treated cellulitis. Specialty Comments:  No specialty comments available.  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   Past Medical History:  Diagnosis Date   Anemia    Arthritis    "left hip" (09/16/2015)   Asthma    Chronic bronchitis (HCC)    "although I haven't had it in the last couple years" (09/16/2015)   Family history of adverse reaction to anesthesia    "dad's BP drops; brother has  hard time waking up" (09/16/2015)   History of gout    Hypertension    Migraine    "I pretty much keep a migraine; really bad probably once/month" (09/16/2015)   Pneumonia "several times"    Family History  Problem Relation Age of Onset   Heart disease Mother    Kidney disease Mother     Past Surgical History:  Procedure Laterality Date   CHOLECYSTECTOMY OPEN  12/1984   ESOPHAGOGASTRODUODENOSCOPY     EYE SURGERY     GLAUCOMA SURGERY Bilateral ~ 2002   JOINT REPLACEMENT     TOTAL HIP ARTHROPLASTY Left 09/16/2015   TOTAL HIP ARTHROPLASTY Left 09/16/2015   Procedure: LEFT TOTAL HIP ARTHROPLASTY ANTERIOR APPROACH;  Surgeon: Tarry Kos, MD;  Location: [facility];  Service: Orthopedics;  Laterality: Left;   Social History   Occupational History   Not on file  Tobacco Use   Smoking status: Never   Smokeless tobacco: Never  Substance and Sexual Activity   Alcohol use: No   Drug use: No   Sexual activity: Not Currently

## 2020-09-21 ENCOUNTER — Ambulatory Visit: Payer: BC Managed Care – PPO | Admitting: Orthopaedic Surgery

## 2021-02-25 ENCOUNTER — Other Ambulatory Visit: Payer: Self-pay

## 2021-02-25 ENCOUNTER — Ambulatory Visit (INDEPENDENT_AMBULATORY_CARE_PROVIDER_SITE_OTHER): Payer: BC Managed Care – PPO | Admitting: Orthopaedic Surgery

## 2021-02-25 DIAGNOSIS — M25552 Pain in left hip: Secondary | ICD-10-CM

## 2021-02-25 NOTE — Progress Notes (Signed)
Office Visit Note   Patient: Taylor Short           Date of Birth: 03-07-1955           MRN: [id number] Visit Date: 02/25/2021              Requested by: Bonnita Nasuti, MD [address] [place],  [place] [address] PCP: Bonnita Nasuti, MD   Assessment & Plan: Visit Diagnoses:  1. Pain in left hip     Plan: I reviewed the MRI scan in detail and does not show any abscess or obvious evidence of infection.  I agree that this would be a very atypical presentation for prosthetic joint infection.  At this point I would like to obtain a bone scan to assess for loosening or infection of the prosthetic joint.  We will see her back after the bone scan.  My impression is that she has chronic soft tissue infection that has not been fully treated or partially treated with each round of antibiotics.  Follow-Up Instructions: No follow-ups on file.   Orders:  Orders Placed This Encounter  Procedures   NM Bone Scan 3 Phase   No orders of the defined types were placed in this encounter.     Procedures: No procedures performed   Clinical Data: No additional findings.   Subjective: Chief Complaint  Patient presents with   Left Hip - Pain    Taylor Short is a 66 year old female who comes back today for recurrent left hip symptoms.  She had another episode in which her left hip and buttock became cellulitic and bright red.  This is happened 5 times now.  She also developed a fever and had vomiting.  She is currently on vancomycin.  She went to [facility] for this.  She did have an MRI of the pelvis which was unremarkable.  She also saw Dr. Koleen Nimrod in [place] who worked her up for prosthetic joint infection back in August.  CRP was elevated but she had a normal sed rate and aspiration of the left hip joint was dry.  She has not returned to see him.  His assessment was that she also had had a very atypical presentation.  She comes in today to see me for recurrent left hip pain.   She has been ambulating with a single-point cane.   Review of Systems  Constitutional: Negative.   HENT: Negative.    Eyes: Negative.   Respiratory: Negative.    Cardiovascular: Negative.   Endocrine: Negative.   Musculoskeletal: Negative.   Neurological: Negative.   Hematological: Negative.   Psychiatric/Behavioral: Negative.    All other systems reviewed and are negative.   Objective: Vital Signs: LMP 09/07/2010   Physical Exam Vitals and nursing note reviewed.  Constitutional:      Appearance: She is well-developed.  Pulmonary:     Effort: Pulmonary effort is normal.  Skin:    General: Skin is warm.     Capillary Refill: Capillary refill takes less than 2 seconds.  Neurological:     Mental Status: She is alert and oriented to person, place, and time.  Psychiatric:        Behavior: Behavior normal.        Thought Content: Thought content normal.        Judgment: Judgment normal.    Ortho Exam  Examination of the left hip shows resolving cellulitis of the lateral hip and buttock.  There is some residual erythema  there consistent with partially treated cellulitis.  The hip scar itself is well-healed without any signs of infection.  She does have asymmetric swelling to the left hip.  I do not feel any soft tissue crepitus.  Range of motion of the hip does cause some mild discomfort in that area.  Specialty Comments:  No specialty comments available.  Imaging: No results found.   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   Past Medical History:  Diagnosis Date   Anemia    Arthritis    "left hip" (09/16/2015)   Asthma    Chronic bronchitis ([place])    "although  I haven't had it in the last couple years" (09/16/2015)   Family history of adverse reaction to anesthesia    "dad's BP drops; brother has hard time waking up" (09/16/2015)   History of gout    Hypertension    Migraine    "I pretty much keep a migraine; really bad probably once/month" (09/16/2015)   Pneumonia "several times"    Family History  Problem Relation Age of Onset   Heart disease Mother    Kidney disease Mother     Past Surgical History:  Procedure Laterality Date   CHOLECYSTECTOMY OPEN  12/1984   ESOPHAGOGASTRODUODENOSCOPY     EYE SURGERY     GLAUCOMA SURGERY Bilateral ~ 2002   JOINT REPLACEMENT     TOTAL HIP ARTHROPLASTY Left 09/16/2015   TOTAL HIP ARTHROPLASTY Left 09/16/2015   Procedure: LEFT TOTAL HIP ARTHROPLASTY ANTERIOR APPROACH;  Surgeon: Leandrew Koyanagi, MD;  Location: [facility];  Service: Orthopedics;  Laterality: Left;   Social History   Occupational History   Not on file  Tobacco Use   Smoking status: Never   Smokeless tobacco: Never  Substance and Sexual Activity   Alcohol use: No   Drug use: No   Sexual activity: Not Currently

## 2021-03-08 ENCOUNTER — Ambulatory Visit (INDEPENDENT_AMBULATORY_CARE_PROVIDER_SITE_OTHER): Payer: BC Managed Care – PPO | Admitting: Internal Medicine

## 2021-03-08 ENCOUNTER — Encounter: Payer: Self-pay | Admitting: Internal Medicine

## 2021-03-08 ENCOUNTER — Other Ambulatory Visit (HOSPITAL_COMMUNITY): Payer: BC Managed Care – PPO

## 2021-03-08 ENCOUNTER — Other Ambulatory Visit: Payer: Self-pay

## 2021-03-08 VITALS — BP 139/86 | HR 89 | Temp 98.2°F | Wt 200.2 lb

## 2021-03-08 DIAGNOSIS — A498 Other bacterial infections of unspecified site: Secondary | ICD-10-CM

## 2021-03-08 DIAGNOSIS — Z96642 Presence of left artificial hip joint: Secondary | ICD-10-CM

## 2021-03-08 NOTE — Progress Notes (Signed)
[facility] for Infectious Disease  Reason for Consult: Concern for recurrent cellulitis  Referring Provider: Raj Janus     HPI:    Taylor Short is a 66 y.o. female with PMHx as below who presents to the clinic for concern for recurrent cellulitis.   She was referred by her primary care clinic ([facility]) due to concern regarding infection of her left hip and question of recurrent cellulitis.  Patient has a history of left hip replacement in 2017 by Dr. Erlinda Hong.  She did well with this hip postoperatively.  However, since approximately December 2021 she has had several (about 5) episodes of left hip and buttock redness, tenderness, and pain.  She has been treated with various courses of oral antibiotics which she reports resulted in some improvement.  She has also received courses of steroids at different intervals that led to improvement, too.    She was seen in July 2022 by her orthopedic surgeon Dr. Erlinda Hong who felt that this was either a cellulitic or dermatologic process.  She saw dermatology who referred her for a second opinion by Dr. Koleen Nimrod at [facility] in [place].  This was in August 2022.  Dr. Koleen Nimrod felt her presentation was atypical for possible recurrent cellulitis versus other inflammatory etiology.  Overall, concern for periprosthetic joint infection was not very high.  She had labs obtained, however, which was notable for ESR 18 (normal) and CRP 13 (upper limit of normal 10).  She subsequently was seen by interventional radiology for left hip aspiration.  However, there was no significant fluid and was a dry tap.    More recently she was hospitalized at [facility] 12/19-12/22 for what was considered to be cellulitis.  She reported cellulitis of the left hip again.  She said it was really bad this time and went around her entire body.  She showed me pictures today to confirm this.  She reported a fever of 102F.  She was seen by orthopedic  surgery in the hospital.  Their suspicion was that this was related to her replaced hip.  She was given vancomycin and cefepime.  She had an MRI of the hip that did not show any deep infection.  She was discharged from the hospital on oral antibiotics (Cefdin and Clindamycin) and has completed these antibiotics.  She also received oral vancomycin for C diff prevention.  She has had subsequent resolution of her redness at this time.  She had follow-up with Dr. Erlinda Hong on 02/26/2020.  He also reviewed her MRI scan that did not show any abscess or obvious evidence of infection.  He is planning to obtain a bone scan to assess for loosening or infection in the prosthetic joint.  He felt that there was the possibility of a chronic soft tissue infection that had been inadequately treated or partially treated with the courses of antibiotics she has received.  Of note, patient has a history of "recurrent UTIs" for which she has received several courses of antibiotics in the past.  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  It was finally treated with fecal transplant in January 2022.    Patient's Medications  New Prescriptions   No medications on file  Previous Medications   ACETAMINOPHEN (TYLENOL) 325 MG TABLET    Take 650 mg by mouth every 6 (six) hours as needed for mild pain.   ALBUTEROL (PROVENTIL HFA;VENTOLIN HFA) 108 (90 BASE) MCG/ACT INHALER  Inhale 2 puffs into the lungs as needed for wheezing or shortness of breath.   ALLOPURINOL (ZYLOPRIM) 300 MG TABLET    Take 300 mg by mouth daily.    ATORVASTATIN (LIPITOR) 20 MG TABLET    Take 1 tablet by mouth daily.   COLESTIPOL (COLESTID) 1 G TABLET    Take 1 tablet by mouth 2 (two) times daily.   DICYCLOMINE (BENTYL) 10 MG CAPSULE    Take 20 mg by mouth 3 (three) times daily.   DIPHENOXYLATE-ATROPINE (LOMOTIL) 2.5-0.025 MG TABLET    Take 1 tablet by mouth 4 (four) times daily as needed for diarrhea or loose stools.   ELIQUIS 5 MG  TABS TABLET    Take 5 mg by mouth 2 (two) times daily.   FEROSUL 325 (65 FE) MG TABLET    Take 325 mg by mouth daily.   GABAPENTIN (NEURONTIN) 300 MG CAPSULE    Take 600 mg by mouth at bedtime.    HYDROCHLOROTHIAZIDE (HYDRODIURIL) 25 MG TABLET    Take 25 mg by mouth daily as needed (for fluid).    INDOMETHACIN (INDOCIN) 50 MG CAPSULE    Take 150 mg by mouth daily.    MONTELUKAST (SINGULAIR) 10 MG TABLET    Take 10 mg by mouth daily.   ONDANSETRON (ZOFRAN ODT) 4 MG DISINTEGRATING TABLET    Take 1 tablet (4 mg total) by mouth every 8 (eight) hours as needed for nausea or vomiting.   POTASSIUM CHLORIDE (K-DUR) 10 MEQ TABLET    Take 10 mEq by mouth 2 (two) times daily.   POTASSIUM CHLORIDE SA (KLOR-CON M) 20 MEQ TABLET    Take by mouth.   VITAMIN D, ERGOCALCIFEROL, (DRISDOL) 1.25 MG (50000 UNIT) CAPS CAPSULE    TAKE 1 CAPSULE BY MOUTH 2 TIMES A WEEK  Modified Medications   No medications on file  Discontinued Medications   CELECOXIB (CELEBREX) 200 MG CAPSULE    Take 1 capsule (200 mg total) by mouth every 12 (twelve) hours.   CIPROFLOXACIN (CIPRO) 500 MG TABLET    Take 1 tablet (500 mg total) by mouth 2 (two) times daily.   ENOXAPARIN (LOVENOX) 40 MG/0.4ML INJECTION    Inject 0.4 mLs (40 mg total) into the skin daily.   HYDROMORPHONE (DILAUDID) 2 MG TABLET    Take 1 tablet (2 mg total) by mouth every 4 (four) hours as needed for severe pain.   METHOCARBAMOL (ROBAXIN) 750 MG TABLET    Take 1 tablet (750 mg total) by mouth 2 (two) times daily as needed for muscle spasms.   ONDANSETRON (ZOFRAN) 4 MG TABLET    Take 1-2 tablets (4-8 mg total) by mouth every 8 (eight) hours as needed for nausea or vomiting.   OXYCODONE (OXYCONTIN) 10 MG 12 HR TABLET    Take 1 tablet (10 mg total) by mouth every 12 (twelve) hours.   SENNA-DOCUSATE (SENOKOT S) 8.6-50 MG TABLET    Take 1 tablet by mouth at bedtime as needed.      Past Medical History:  Diagnosis Date   Anemia    Arthritis    "left hip" (09/16/2015)    Asthma    Chronic bronchitis ([place])    "although I haven't had it in the last couple years" (09/16/2015)   Family history of adverse reaction to anesthesia    "dad's BP drops; brother has hard time waking up" (09/16/2015)   History of gout    Hypertension    Migraine    "I pretty  much keep a migraine; really bad probably once/month" (09/16/2015)   Pneumonia "several times"    Social History   Tobacco Use   Smoking status: Never   Smokeless tobacco: Never  Substance Use Topics   Alcohol use: No   Drug use: No    Family History  Problem Relation Age of Onset   Heart disease Mother    Kidney disease Mother     Allergies  Allergen Reactions   Aspirin Other (See Comments)    Nose bleeds   Sulfa Antibiotics Hives    Other reaction(s): hives    Other Other (See Comments) and Hives    Ingredients in EPIDURALS   Amoxicillin-Pot Clavulanate Diarrhea and Hives    Other reaction(s): diarrhea    Codeine Other (See Comments)    Hallucinations    Review of Systems  All other systems reviewed and are negative. Except as noted above.     OBJECTIVE:    Vitals:   03/08/21 1432  BP: 139/86  Pulse: 89  Temp: 98.2 F (36.8 C)  TempSrc: Oral  Weight: 200 lb 3.2 oz (90.8 kg)     Body mass index is 31.83 kg/m.  Physical Exam Constitutional:      General: She is not in acute distress.    Appearance: Normal appearance.  HENT:     Head: Normocephalic and atraumatic.  Eyes:     Extraocular Movements: Extraocular movements intact.     Conjunctiva/sclera: Conjunctivae normal.  Pulmonary:     Effort: Pulmonary effort is normal. No respiratory distress.  Musculoskeletal:        General: Normal range of motion.  Skin:    General: Skin is warm and dry.  Neurological:     General: No focal deficit present.     Mental Status: She is alert and oriented to person, place, and time.  Psychiatric:        Mood and Affect: Mood normal.        Behavior: Behavior normal.     Labs  and Microbiology:  CBC Latest Ref Rng & Units 01/27/2020 10/29/2018 09/17/2015  WBC - 7.8 6.6 5.6  Hemoglobin 12.0 - 16.0 12.4 12.4 10.0(L)  Hematocrit 36 - 46 36 39.8 30.9(L)  Platelets 150 - 399 286 385 184   CMP Latest Ref Rng & Units 01/27/2020 10/29/2018 09/17/2015  Glucose 70 - 99 mg/dL - 124(H) 124(H)  BUN 4 - 21 30(A) 15 8  Creatinine 0.5 - 1.1 1.2(A) 0.95 0.61  Sodium 137 - 147 140 138 139  Potassium 3.4 - 5.3 3.6 3.5 3.8  Chloride 99 - 108 101 102 107  CO2 13 - 22 27(A) 24 26  Calcium 8.7 - 10.7 9.5 9.5 8.5(L)  Total Protein 6.5 - 8.1 g/dL - 7.6 -  Total Bilirubin 0.3 - 1.2 mg/dL - 0.6 -  Alkaline Phos 25 - 125 112 128(H) -  AST 13 - 35 31 21 -  ALT 7 - 35 18 34 -      ASSESSMENT & PLAN:    1. History of total left hip replacement   2. Recurrent Clostridioides difficile infection   She presents with recurrent episodes of redness, pain, and tenderness of her left hip and buttocks that has raised concern for recurrent infection.  Fortunately, work up has not shown an infection involving her prosthetic hip joint.  This has included a recent MRI in December 2022, unsuccessful joint aspiration in August 2022, and relatively reassuring inflammatory markers in August 2022 as  well.  The question of a partially treated chronic soft tissue infection has been raised, however, I think this is probably unlikely as she has received several courses of various antibiotics which should have adequately treated a superficial skin and soft tissue infection.  Nonetheless, she recently completed 5 days of vancomycin/cefepime in the hospital followed by an additional 7 days of oral therapy after discharge.  Again, this should have adequately treated any soft tissue infection.  She also has responded positively to steroids in the past which would point away from an infectious etiology.    Her orthopedic surgeon is planning for a bone scan scheduled for later this week to definitively assess for any  hardware loosening or infection involving her prosthetic joint.  I think this is a good approach to determine her next steps as any prosthetic joint infection would require surgical intervention in addition to antibiotic therapy.  Discussed at length with the patient the judicious use of antibiotics in the setting of her prior history of C. difficile that required fecal transplant in January 2022.  For now, would recommend that she hold any further antibiotics.  If her bone scan is unremarkable and this issue continues to recur, would consider noninfectious inflammatory processes or possible allergic type reaction.  Follow-up as needed for now.  If this recurs, will be happy to see her again to reassess.   Raynelle Highland for Infectious Disease [facility] 03/08/2021, 3:25 PM  I spent 60 minutes dedicated to the care of this patient on the date of this encounter to include pre-visit review of records, face-to-face time with the patient discussing rash, hip replacement, cellulitis, and post-visit ordering of testing.

## 2021-03-11 ENCOUNTER — Encounter (HOSPITAL_COMMUNITY)
Admission: RE | Admit: 2021-03-11 | Discharge: 2021-03-11 | Disposition: A | Discharge status: Home / Self Care | Payer: BC Managed Care – PPO | Source: Ambulatory Visit | Attending: Orthopaedic Surgery | Admitting: Orthopaedic Surgery

## 2021-03-11 ENCOUNTER — Other Ambulatory Visit: Payer: Self-pay

## 2021-03-11 ENCOUNTER — Ambulatory Visit (HOSPITAL_COMMUNITY)
Admission: RE | Admit: 2021-03-11 | Discharge: 2021-03-11 | Disposition: A | Discharge status: Home / Self Care | Payer: BC Managed Care – PPO | Source: Ambulatory Visit | Attending: Orthopaedic Surgery | Admitting: Orthopaedic Surgery

## 2021-03-11 DIAGNOSIS — M25552 Pain in left hip: Secondary | ICD-10-CM | POA: Insufficient documentation

## 2021-03-15 ENCOUNTER — Encounter: Payer: Self-pay | Admitting: Internal Medicine

## 2021-03-22 ENCOUNTER — Encounter: Payer: Self-pay | Admitting: Orthopaedic Surgery

## 2021-03-22 ENCOUNTER — Other Ambulatory Visit: Payer: Self-pay

## 2021-03-22 ENCOUNTER — Ambulatory Visit: Payer: BC Managed Care – PPO | Admitting: Orthopaedic Surgery

## 2021-03-22 DIAGNOSIS — M25552 Pain in left hip: Secondary | ICD-10-CM | POA: Diagnosis not present

## 2021-03-22 NOTE — Progress Notes (Signed)
Office Visit Note   Patient: Taylor Short           Date of Birth: [birth year]           MRN: [id number] Visit Date: 03/22/2021              Requested by: Galvin Proffer, MD [id number] [place],  [place] [address] PCP: Galvin Proffer, MD   Assessment & Plan: Visit Diagnoses:  1. Pain in left hip     Plan: Patient returns today to discuss recent bone scan.  She has had resolution of the hip cellulitis.  The bone scan shows no evidence of infection or loosening of the implant.  This is reassuring to both of [place].  Based on my assessment I feel that what she is experiencing is either an immunologic or allergic reaction to something or latent infection that reactivates periodically.  I think it might be a good idea to try to take long-term antibiotics per Dr. Philis Pique recommendations.  Questions encouraged and answered.  Follow-up as needed.  If and when recurs we will be happy to see her in the office immediately.  Follow-Up Instructions: No follow-ups on file.   Orders:  No orders of the defined types were placed in this encounter.  No orders of the defined types were placed in this encounter.     Procedures: No procedures performed   Clinical Data: No additional findings.   Subjective: Chief Complaint  Patient presents with   Left Hip - Follow-up    HPI  Review of Systems   Objective: Vital Signs: LMP 09/07/2010   Physical Exam  Ortho Exam  Specialty Comments:  No specialty comments available.  Imaging: No results found.   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   Past Medical History:  Diagnosis Date   Anemia    Arthritis    "left  hip" (09/16/2015)   Asthma    Chronic bronchitis (HCC)    "although I haven't had it in the last couple years" (09/16/2015)   Family history of adverse reaction to anesthesia    "dad's BP drops; brother has hard time waking up" (09/16/2015)   History of gout    Hypertension    Migraine    "I pretty much keep a migraine; really bad probably once/month" (09/16/2015)   Pneumonia "several times"    Family History  Problem Relation Age of Onset   Heart disease Mother    Kidney disease Mother     Past Surgical History:  Procedure Laterality Date   CHOLECYSTECTOMY OPEN  12/1984   ESOPHAGOGASTRODUODENOSCOPY     EYE SURGERY     GLAUCOMA SURGERY Bilateral ~ 2002   JOINT REPLACEMENT     TOTAL HIP ARTHROPLASTY Left 09/16/2015   TOTAL HIP ARTHROPLASTY Left 09/16/2015   Procedure: LEFT TOTAL HIP ARTHROPLASTY ANTERIOR APPROACH;  Surgeon: Tarry Kos, MD;  Location: [facility] OR;  Service: Orthopedics;  Laterality: Left;   Social History   Occupational History   Not on file  Tobacco Use   Smoking status: Never   Smokeless tobacco: Never  Substance and Sexual Activity   Alcohol use: No   Drug use: No  Sexual activity: Not Currently

## 2023-06-19 ENCOUNTER — Encounter: Admitting: Internal Medicine

## 2023-06-19 NOTE — Progress Notes (Signed)
 This encounter was created in error - please disregard.
